# Patient Record
Sex: Female | Born: 1990 | Race: Black or African American | Hispanic: No | Smoking: Former smoker
Health system: Southern US, Community
[De-identification: ages and names within clinical notes are randomized; demographics above are authoritative.]

## PROBLEM LIST (undated history)

## (undated) DIAGNOSIS — I10 Essential (primary) hypertension: Secondary | ICD-10-CM

## (undated) DIAGNOSIS — J45909 Unspecified asthma, uncomplicated: Secondary | ICD-10-CM

## (undated) DIAGNOSIS — K649 Unspecified hemorrhoids: Secondary | ICD-10-CM

---
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
## 2010-08-28 LAB — CULTURE, ROUTINE-ABSCESS

## 2010-08-29 LAB — URINALYSIS, ROUTINE W REFLEX MICROSCOPIC
Bilirubin Urine: NEGATIVE
Glucose, UA: NEGATIVE mg/dL
Hgb urine dipstick: NEGATIVE
Ketones, ur: NEGATIVE mg/dL
Nitrite: NEGATIVE
Protein, ur: NEGATIVE mg/dL
Specific Gravity, Urine: 1.024 (ref 1.005–1.030)
Urobilinogen, UA: 0.2 mg/dL (ref 0.0–1.0)
pH: 6 (ref 5.0–8.0)

## 2010-08-29 LAB — URINE CULTURE: Colony Count: 60000

## 2010-08-29 LAB — PREGNANCY, URINE: Preg Test, Ur: NEGATIVE

## 2010-08-29 LAB — GLUCOSE, CAPILLARY: Glucose-Capillary: 89 mg/dL (ref 70–99)

## 2010-08-30 LAB — GLUCOSE, CAPILLARY: Glucose-Capillary: 88 mg/dL (ref 70–99)

## 2010-08-30 LAB — CULTURE, ROUTINE-ABSCESS

## 2013-10-23 ENCOUNTER — Emergency Department (HOSPITAL_COMMUNITY)
Admission: EM | Admit: 2013-10-23 | Discharge: 2013-10-23 | Disposition: A | Discharge status: Home / Self Care | Payer: Medicaid - Out of State | Attending: Emergency Medicine | Admitting: Emergency Medicine

## 2013-10-23 ENCOUNTER — Encounter (HOSPITAL_COMMUNITY): Payer: Self-pay | Admitting: Emergency Medicine

## 2013-10-23 DIAGNOSIS — K089 Disorder of teeth and supporting structures, unspecified: Secondary | ICD-10-CM | POA: Insufficient documentation

## 2013-10-23 DIAGNOSIS — Z880 Allergy status to penicillin: Secondary | ICD-10-CM | POA: Insufficient documentation

## 2013-10-23 DIAGNOSIS — K0889 Other specified disorders of teeth and supporting structures: Secondary | ICD-10-CM

## 2013-10-23 DIAGNOSIS — K029 Dental caries, unspecified: Secondary | ICD-10-CM | POA: Insufficient documentation

## 2013-10-23 MED ORDER — TRAMADOL HCL 50 MG PO TABS: 50.0000 mg | ORAL_TABLET | Freq: Four times a day (QID) | ORAL | Status: DC | PRN

## 2013-10-23 NOTE — Discharge Instructions (Signed)
Take the prescribed medication as directed.  May take with tylenol for added relief. Follow-up with dentist-- referrals and resource guide provided to help with this. Return to the ED for new or worsening symptoms.   Emergency Department Resource Guide 1) Find a Doctor and Pay Out of Pocket Although you won't have to find out who is covered by your insurance plan, it is a good idea to ask around and get recommendations. You will then need to call the office and see if the doctor you have chosen will accept you as a new patient and what types of options they offer for patients who are self-pay. Some doctors offer discounts or will set up payment plans for their patients who do not have insurance, but you will need to ask so you aren't surprised when you get to your appointment.  2) Contact Your Local Health Department Not all health departments have doctors that can see patients for sick visits, but many do, so it is worth a call to see if yours does. If you don't know where your local health department is, you can check in your phone book. The CDC also has a tool to help you locate your state's health department, and many state websites also have listings of all of their local health departments.  3) Find a Walk-in Clinic If your illness is not likely to be very severe or complicated, you may want to try a walk in clinic. These are popping up all over the country in pharmacies, drugstores, and shopping centers. They're usually staffed by nurse practitioners or physician assistants that have been trained to treat common illnesses and complaints. They're usually fairly quick and inexpensive. However, if you have serious medical issues or chronic medical problems, these are probably not your best option.  No Primary Care Doctor: - Call Health Connect at  [phone] - they can help you locate a primary care doctor that  accepts your insurance, provides certain services, etc. - Physician Referral Service-  [phone]  Chronic Pain Problems: Organization         Address  Phone   Notes  [facility]  [phone] Patients need to be referred by their primary care doctor.   Medication Assistance: Organization         Address  Phone   Notes  [facility] Medication [facility] [address] [place] [place], [place] [address] [phone] --Must be a resident of [facility] -- Must have NO insurance coverage whatsoever (no Medicaid/ Medicare, etc.) -- The pt. MUST have a primary care doctor that directs their care regularly and follows them in the community   [facility]  [phone]   [employer]  [phone]    Agencies that provide inexpensive medical care: Organization         Address  Phone   Notes  [facility]  [phone]   [facility]    [phone]   [facility] [address] [place], [place] [address] [phone]   [facility] [address] [place] [place] [phone]   Planned Parenthood    [phone]   [facility]    [phone]   [facility]  [address], [place] Phone:  [phone], Fax:  [phone] Hours of Operation:  9 am - 6 pm, M-F.  Also accepts Medicaid/Medicare and self-pay.  [facility]  [address], [place] Phone: [phone], Fax: [phone]. Hours of Operation:  8:30 am - 5:30 pm, M-F.  Also accepts Medicaid and self-pay.  [facility] [place] [address], [place] Phone: [phone]   [place], [place], [place] [phone], Ext. [phone] Mondays & Thursdays: 7-9 AM.  First 15 patients are seen on a first come, first serve basis.    [place] Providers:  Organization         Address  Phone   Notes  [facility] [address],  [place] [phone] Also accepts self-pay patients.  [facility] [address], [place]  [phone]   [place], [address], [place] [phone]   [facility] [address] [place] [phone]   Lucianne Lei [address], [place]   [phone] Only accepts [place] Access [place] patients after they have their name applied to their card.   Self-Pay (no insurance) in [facility]:  Organization         Address  Phone   Notes  Sickle Cell Patients, [facility] Internal Medicine [place] [phone]   [facility] Urgent Care [place] [phone]   [facility] [place]  [place], [place], [place] [phone]   [facility]/Dr. Osei-Bonsu  [address], [place] or [address], [place] [phone] Phone number for both [place] and [place] locations is the same.  [facility] [address], [place] [phone]   [facility] [address] [place] or [address] [phone]   [facility] [address], [place] [phone], phone; [phone], fax Sees patients 1st and 3rd Saturday of every month.  Must not qualify for public or private insurance (i.e. Medicaid, Medicare, [place] Health Choice, Veterans' Benefits)  Household income should be no more than 200% of the poverty level The clinic cannot treat you if you are pregnant or think you are pregnant  Sexually transmitted diseases are not treated at the clinic.    Dental Care: Organization         Address  Phone  Notes  [facility] Department of [facility] [phone] Accepts children up to age 29 who are enrolled in  [place] or [place]; pregnant women with a Medicaid card; and children who have applied for Medicaid or [place] Health Choice, but were declined, whose parents can pay a reduced fee at time of service.  [facility] Department of [facility]  [address], [place] [phone] Accepts children up to age 36 who are enrolled in [place] or [place]; pregnant women with a Medicaid card; and children who have applied for Medicaid or [place] Health Choice, but were declined, whose parents can pay a reduced fee at time of service.  [facility] PRO[facility]  [place] [phone] Patients are seen by appointment only. Walk-ins are not accepted. [place] will see patients 66 years of age and older. Monday - Tuesday (8am-5pm) Most Wednesdays (8:30-5pm) $30 per visit, cash only  [facility]  [address], [facility] [phone] Patients are seen by appointment only. Walk-ins are not accepted. [place] will see patients 55 years of age and older. One Wednesday Evening (Monthly: Volunteer Based).  $30 per visit, cash only  [place]  [phone] for adults; Children under age 16, call Graduate Pediatric Dentistry at [phone]. Children aged 15-14, please call [phone] to request a pediatric application.  Dental services are provided in all areas of dental care including fillings, crowns and bridges, complete and partial dentures, implants, gum treatment, root canals, and extractions. Preventive care is also provided. Treatment is provided to both adults and children. Patients are selected via a lottery and there is often a waiting list.   [facility] [address] [place]  [phone] [email or web address]   [facility] [address] [place], [place] [phone], Ext. [phone] Second and Fourth Thursday of each month, opens at 6:30  AM; Clinic ends at 9 AM.  Patients are seen on a first-come first-served basis, and a limited number are seen during each clinic.   [facility]  [address] Hillard Danker [place], [place] [phone]   Eligibility Requirements You must have lived in [place], [place], or [place] counties for at least the last three months.   You cannot be eligible for state or federal sponsored [employer], including [employer], [place], or [employer].   You generally cannot be eligible for healthcare insurance through your employer.    How to apply: Eligibility screenings are held every Tuesday and Wednesday afternoon from 1:00 pm until 4:00 pm. You do not need an appointment for the interview!  [facility] [address] [place], [place]   [employer]  [place] Department  [place]  [phone]    Behavioral Health Resources in the Community: Intensive Outpatient Programs Organization         Address  Phone  Notes  [place]. [address], [place], [place] [phone]   [facility] Outpatient [address], [place], [employer]   ADS: [facility] [address], [place], [place]   [place] [address]  [place], [place] or [phone]   Substance Abuse Resources Organization         Address  Phone  Notes  Al[facility] Drug Services  [phone]   [place]  [phone]   [facility] [place]   Chinita Pester  [phone]   Residential & Outpatient Substance Abuse Program  [phone]   Psychological Services Organization         Address  Phone  Notes  [facility] [place]  [place]  [phone]   [place] [address] [place] [phone] or  [phone]    Mobile Crisis Teams Organization         Address  Phone  Notes  [facility], Mobile Crisis Care Unit  [phone]   [facility]  [address] [place], [place]   [place] [address] [place] [phone]    Self-Help/Support Groups Organization         Address  Phone             Notes  [facility] [place] - variety of support groups  [place] Call for more information  Narcotics Anonymous (NA), [facility] [address], [employer]   2 meetings at this location   Special educational needs teacher         Address  Phone  Notes  [facility] [place],    [place]  [phone]   [facility]  [address] [place] [phone], [place], [place]   [place], [place] [phone] Admissions: 8am-3pm M-F  Incentives Substance [place] [address]    [place], [place] [phone]   [facility] [address] [place], [place], [place]   [facility] [address]  [place], [place]   [facility] - Intensive Outpatient [address], [place], [place]   [facility] ([place].,  [place], [place] [phone] or [phone]   [facility] ([facility]) [address], [place], [place] Accepts Medicaid  [facility] [address],  [place] [phone] Substance Abuse/Addiction Treatment   [facility] Organization         Address  Phone  Notes  [facility]  [phone]   Domenic Schwab, PhD [address] Arlis Porta [place], [place]   [phone] or [phone]   [place], [place] [phone]   [facility] [address]  [place], [place] [phone] Insurance/Medicaid/sponsorship through [facility] and Families [address], [address] [place]                                    [place], [place] [phone] [place] [address], [place] [phone]    Dr. Adele Schilder  [phone]   [facility] Dept. 1) [address], [place] 2) [place] 3)  [place] [address], [place] [phone]  [phone]   [place] [phone] or [phone] (After Hours)

## 2013-10-23 NOTE — ED Provider Notes (Signed)
CSN: [id number]     Arrival date & time 10/23/13  0605 History   First MD Initiated Contact with Patient 10/23/13 [phone]     Chief Complaint  Patient presents with  . Dental Problem     (Consider location/radiation/quality/duration/timing/severity/associated sxs/prior Treatment) The history is provided by the patient and medical records.   This is a 23 y.o. F with no significant PMH presenting to the ED for dental pain.  Patient states her right upper molar broke approximately one year ago the, but has not been bothersome until the past 2 weeks. She states she thinks another piece may have broken off upper tooth. Pain is worse with chewing on the affected side.  No difficulty swallowing. Denies any fever or chills. No facial swelling. Patient is not currently established with a dentist.  History reviewed. No pertinent past medical history. History reviewed. No pertinent past surgical history. No family history on file. History  Substance Use Topics  . Smoking status: Never Smoker   . Smokeless tobacco: Not on file  . Alcohol Use: No   OB History   Grav Para Term Preterm Abortions TAB SAB Ect Mult Living                 Review of Systems  HENT: Positive for dental problem.   All other systems reviewed and are negative.     Allergies  Ciprocinonide; Penicillins; and Aspirin  Home Medications   Prior to Admission medications   Not on File   BP 133/74  Pulse 74  Temp(Src) 98.1 F (36.7 C)  Resp 18  Ht 5\' 6"  (1.676 m)  Wt 320 lb (145.151 kg)  BMI 51.67 kg/m2  SpO2 98%  LMP 10/05/2013  Physical Exam  Nursing note and vitals reviewed. Constitutional: She is oriented to person, place, and time. She appears well-developed and well-nourished.  HENT:  Head: Normocephalic and atraumatic.  Mouth/Throat: Uvula is midline, oropharynx is clear and moist and mucous membranes are normal. No oral lesions. No trismus in the jaw. Abnormal dentition. Dental caries present. No dental  abscesses. No oropharyngeal exudate, posterior oropharyngeal edema, posterior oropharyngeal erythema or tonsillar abscesses.  Teeth largely in poor dentition, severe dental caries; right upper molar broken with large cavity present, surrounding gingiva normal in appearance without signs of dental abscess, handling secretions appropriately, no trismus  Eyes: Conjunctivae and EOM are normal. Pupils are equal, round, and reactive to light.  Neck: Normal range of motion.  Cardiovascular: Normal rate, regular rhythm and normal heart sounds.   Pulmonary/Chest: Effort normal and breath sounds normal.  Musculoskeletal: Normal range of motion.  Neurological: She is alert and oriented to person, place, and time.  Skin: Skin is warm and dry.  Psychiatric: She has a normal mood and affect.    ED Course  Procedures (including critical care time) Labs Review Labs Reviewed - No data to display  Imaging Review No results found.   EKG Interpretation None      MDM   Final diagnoses:  Pain, dental   Dental pain without signs of dental abscess.  Pt is overall well appearing.  Given tramadol for pain, will FU with dentist-- referrals and resource guide provided.  Discussed plan with patient, he/she acknowledged understanding and agreed with plan of care.  Return precautions given for new or worsening symptoms.  Garlon Hatchet, PA-C 10/23/13 [phone]

## 2013-10-23 NOTE — ED Notes (Signed)
Discharge instructions reviewed with pt. Pt verbalized understanding.   

## 2013-10-23 NOTE — ED Notes (Signed)
Pt c/o back upper rt molar pain. Pt states she has been having the pain intermittently for a year. Pt rates pain 8/10. Pt reports difficulty chewing on the tooth. Pt has multiple cavities. Pt reports not being seen by a dentist due to moving around a lot.

## 2013-10-23 NOTE — ED Provider Notes (Signed)
Medical screening examination/treatment/procedure(s) were performed by non-physician practitioner and as supervising physician I was immediately available for consultation/collaboration.   EKG Interpretation None       Djimon Lundstrom M Irma Delancey, MD 10/23/13 0739 

## 2013-10-23 NOTE — ED Notes (Signed)
The pt is c/o a toothache for one year.  Worse for the past few days.  She also has had a rt side jerking for 2 weeks lmp may 14

## 2014-05-16 ENCOUNTER — Inpatient Hospital Stay
Admit: 2014-05-16 | Discharge: 2014-05-16 | Disposition: A | Discharge status: Home / Self Care | Attending: Emergency Medicine

## 2014-05-16 ENCOUNTER — Encounter: Admit: 2014-05-16 | Primary: Internal Medicine

## 2014-05-16 DIAGNOSIS — R059 Cough, unspecified: Secondary | ICD-10-CM

## 2014-05-16 LAB — STREP SCREEN GROUP A THROAT: Rapid Strep A Screen: NEGATIVE

## 2014-05-16 LAB — URINALYSIS
Bilirubin Urine: NEGATIVE
Blood, Urine: NEGATIVE
Glucose, Ur: NEGATIVE mg/dL
Ketones, Urine: NEGATIVE mg/dL
Leukocyte Esterase, Urine: NEGATIVE
Nitrite, Urine: NEGATIVE
Protein, UA: NEGATIVE mg/dL
Specific Gravity, UA: 1.02 (ref 1.005–1.030)
Urobilinogen, Urine: 0.2 E.U./dL (ref <2.0)
pH, UA: 7 (ref 5.0–9.0)

## 2014-05-16 LAB — CBC WITH AUTO DIFFERENTIAL
Basophils %: 1 % (ref 0–2)
Basophils Absolute: 0.06 E9/L (ref 0.00–0.20)
Eosinophils %: 10 % — ABNORMAL HIGH (ref 0–6)
Eosinophils Absolute: 0.65 E9/L — ABNORMAL HIGH (ref 0.05–0.50)
Hematocrit: 38.5 % (ref 34.0–48.0)
Hemoglobin: 12.7 g/dL (ref 11.5–15.5)
Lymphocytes %: 36 % (ref 20–42)
Lymphocytes Absolute: 2.35 E9/L (ref 1.50–4.00)
MCH: 27.4 pg (ref 26.0–35.0)
MCHC: 33 % (ref 32.0–34.5)
MCV: 83 fL (ref 80.0–99.9)
MPV: 9.1 fL (ref 7.0–12.0)
Monocytes %: 11 % (ref 2–12)
Monocytes Absolute: 0.69 E9/L (ref 0.10–0.95)
Neutrophils %: 43 % (ref 43–80)
Neutrophils Absolute: 2.79 E9/L (ref 1.80–7.30)
Platelets: 265 E9/L (ref 130–450)
RBC: 4.64 E12/L (ref 3.50–5.50)
RDW: 16.5 fL — ABNORMAL HIGH (ref 11.5–15.0)
WBC: 6.6 E9/L (ref 4.5–11.5)

## 2014-05-16 LAB — COMPREHENSIVE METABOLIC PANEL
ALT: 13 U/L (ref 0–32)
AST: 18 U/L (ref 0–31)
Albumin: 3.5 g/dL (ref 3.5–5.2)
Alkaline Phosphatase: 89 U/L (ref 35–104)
Anion Gap: 11 mmol/L (ref 7–16)
BUN: 6 mg/dL (ref 6–20)
CO2: 24 mmol/L (ref 22–29)
Calcium: 9.3 mg/dL (ref 8.6–10.2)
Chloride: 103 mmol/L (ref 98–107)
Creatinine: 0.7 mg/dL (ref 0.5–1.0)
GFR African American: >60
GFR Non-African American: >60 mL/min/{1.73_m2} (ref >=60)
Glucose: 78 mg/dL (ref 74–109)
Potassium: 4.4 mmol/L (ref 3.5–5.0)
Sodium: 138 mmol/L (ref 132–146)
Total Bilirubin: 0.1 mg/dL (ref 0.0–1.2)
Total Protein: 7.9 g/dL (ref 6.4–8.3)

## 2014-05-16 LAB — POC PREGNANCY UR-QUAL: Preg Test, Ur: NEGATIVE

## 2014-05-16 LAB — MICROSCOPIC URINALYSIS

## 2014-05-16 LAB — LIPASE: Lipase: 20 U/L (ref 13–60)

## 2014-05-16 LAB — LACTIC ACID: Lactic Acid: 1.1 mmol/L (ref 0.5–2.2)

## 2014-05-16 MED ORDER — PREDNISONE 20 MG PO TABS
20 MG | Freq: Once | ORAL | Status: DC
Start: 2014-05-16 — End: 2014-05-16

## 2014-05-16 MED ORDER — GI COCKTAIL
Freq: Once | Status: AC
Start: 2014-05-16 — End: 2014-05-16
  Administered 2014-05-16: 17:00:00 30 mL via ORAL

## 2014-05-16 MED ORDER — ALBUTEROL SULFATE HFA 108 (90 BASE) MCG/ACT IN AERS
108 (90 Base) MCG/ACT | RESPIRATORY_TRACT | Status: AC | PRN
Start: 2014-05-16 — End: 2015-12-02

## 2014-05-16 MED ORDER — DEXAMETHASONE SODIUM PHOSPHATE 10 MG/ML IJ SOLN
10 MG/ML | Freq: Once | INTRAMUSCULAR | Status: AC
Start: 2014-05-16 — End: 2014-05-16
  Administered 2014-05-16: 19:00:00 10 mg via INTRAVENOUS

## 2014-05-16 MED ORDER — ONDANSETRON HCL 4 MG/2ML IJ SOLN
4 MG/2ML | Freq: Once | INTRAMUSCULAR | Status: AC
Start: 2014-05-16 — End: 2014-05-16
  Administered 2014-05-16: 17:00:00 8 mg via INTRAVENOUS

## 2014-05-16 MED ORDER — PROMETHAZINE HCL 25 MG/ML IJ SOLN
25 MG/ML | Freq: Once | INTRAMUSCULAR | Status: DC
Start: 2014-05-16 — End: 2014-05-16

## 2014-05-16 MED ORDER — METOCLOPRAMIDE HCL 5 MG/ML IJ SOLN
5 MG/ML | Freq: Once | INTRAMUSCULAR | Status: AC
Start: 2014-05-16 — End: 2014-05-16
  Administered 2014-05-16: 19:00:00 10 mg via INTRAVENOUS

## 2014-05-16 MED ORDER — PHENYLEPH-PROMETHAZINE-COD 5-6.25-10 MG/5ML PO SYRP
ORAL | Status: AC | PRN
Start: 2014-05-16 — End: 2014-05-23

## 2014-05-16 MED ORDER — SODIUM CHLORIDE 0.9 % IV BOLUS
0.9 % | Freq: Once | INTRAVENOUS | Status: AC
Start: 2014-05-16 — End: 2014-05-16
  Administered 2014-05-16: 17:00:00 500 mL via INTRAVENOUS

## 2014-05-16 MED ORDER — PROMETHAZINE HCL 25 MG/ML IJ SOLN
25 MG/ML | Freq: Once | INTRAMUSCULAR | Status: AC
Start: 2014-05-16 — End: 2014-05-16
  Administered 2014-05-16: 18:00:00 12.5 mg via INTRAVENOUS

## 2014-05-16 MED ORDER — IPRATROPIUM-ALBUTEROL 0.5-2.5 (3) MG/3ML IN SOLN
Freq: Once | RESPIRATORY_TRACT | Status: AC
Start: 2014-05-16 — End: 2014-05-16
  Administered 2014-05-16: 17:00:00 1 via RESPIRATORY_TRACT

## 2014-05-16 MED ORDER — FAMOTIDINE 20 MG PO TABS
20 MG | ORAL_TABLET | Freq: Two times a day (BID) | ORAL | Status: DC
Start: 2014-05-16 — End: 2015-10-30

## 2014-05-16 MED FILL — GI COCKTAIL: Qty: 30

## 2014-05-16 MED FILL — DEXAMETHASONE SODIUM PHOSPHATE 10 MG/ML IJ SOLN: 10 MG/ML | INTRAMUSCULAR | Qty: 1

## 2014-05-16 MED FILL — ONDANSETRON HCL 4 MG/2ML IJ SOLN: 4 MG/2ML | INTRAMUSCULAR | Qty: 4

## 2014-05-16 MED FILL — PROMETHAZINE HCL 25 MG/ML IJ SOLN: 25 MG/ML | INTRAMUSCULAR | Qty: 1

## 2014-05-16 MED FILL — IPRATROPIUM-ALBUTEROL 0.5-2.5 (3) MG/3ML IN SOLN: RESPIRATORY_TRACT | Qty: 3

## 2014-05-16 MED FILL — METOCLOPRAMIDE HCL 5 MG/ML IJ SOLN: 5 MG/ML | INTRAMUSCULAR | Qty: 2

## 2014-05-16 NOTE — ED Notes (Signed)
Pt offered liquids at this time.     Yvetta Coderawnya Abbegale Stehle, RN  05/16/14 1248

## 2014-05-16 NOTE — ED Notes (Signed)
Pt given 8mg  Zofran but still experienced nausea after GI cocktail. Friend at bedside offering support. Pt offered comfort and cold wet rag.     Yvetta Coderawnya Debria Broecker, RN  05/16/14 1155

## 2014-05-16 NOTE — ED Notes (Signed)
[facility] to [employer] [facility]     Juvon Teater, RN  05/16/14 1158

## 2014-05-16 NOTE — ED Provider Notes (Signed)
HPI:  05/16/14,   Time: 11:53 AM         Candice Yates is a 23 y.o. female presenting to the ED for several complaints including cough sore throat nausea vomiting, beginning 4 days ago.  The complaint has been intermittent, moderate in severity, and worsened by when she smokes.  Patient presents for several complaints. States she's had runny nose and itchy eyes sore throat and nonproductive cough for the last 4-5 days. Current everyday cigar smoker. Denies sick contacts. She also reports she's been throwing up any time she eats anything. Denies hematemesis or hematochezia. Last vomited once yesterday. She does report she was able to smoke a cigar and have breakfast this morning without difficulty. She denies fevers chills back pain abdominal pain or other complaints. She did not receive a flu vaccine this year    ROS:   Pertinent positives and negatives are stated within HPI, all other systems reviewed and are negative.  --------------------------------------------- PAST HISTORY ---------------------------------------------  Past Medical History:  has a past medical history of PMDD (premenstrual dysphoric disorder).    Past Surgical History:  has no past surgical history on file.    Social History:  reports that she has been smoking Cigars.  She has never used smokeless tobacco. She reports that she drinks alcohol. She reports that she does not use illicit drugs.    Family History: family history is not on file.     The patient???s home medications have been reviewed.    Allergies: Aspirin; Clindamycin/lincomycin; Flagyl; and Pcn    -------------------------------------------------- RESULTS -------------------------------------------------  All laboratory and radiology results have been personally reviewed by myself   LABS:  Results for orders placed during the hospital encounter of 05/16/14   STREP SCREEN GROUP A THROAT       Result Value Ref Range    Rapid Strep A Screen Negative  Negative    Rapid A Strep  Antigen QC see below     THROAT CULTURE       Result Value Ref Range    Strep A Culture Evaluating for Beta hemolytic streptococci     CBC WITH AUTO DIFFERENTIAL       Result Value Ref Range    WBC 6.6  4.5 - 11.5 E9/L    RBC 4.64  3.50 - 5.50 E12/L    Hemoglobin 12.7  11.5 - 15.5 g/dL    Hematocrit 24.438.5  01.034.0 - 48.0 %    MCV 83.0  80.0 - 99.9 fL    MCH 27.4  26.0 - 35.0 pg    MCHC 33.0  32.0 - 34.5 %    RDW 16.5 (*) 11.5 - 15.0 fL    Platelets 265  130 - 450 E9/L    MPV 9.1  7.0 - 12.0 fL    Neutrophils Relative 43  43 - 80 %    Lymphocytes Relative 36  20 - 42 %    Monocytes Relative 11  2 - 12 %    Eosinophils Relative Percent 10 (*) 0 - 6 %    Basophils Relative 1  0 - 2 %    Neutrophils Absolute 2.79  1.80 - 7.30 E9/L    Lymphocytes Absolute 2.35  1.50 - 4.00 E9/L    Monocytes Absolute 0.69  0.10 - 0.95 E9/L    Eosinophils Absolute 0.65 (*) 0.05 - 0.50 E9/L    Basophils Absolute 0.06  0.00 - 0.20 E9/L    Anisocytosis 1+     COMPREHENSIVE  METABOLIC PANEL       Result Value Ref Range    Sodium 138  132 - 146 mmol/L    Potassium 4.4  3.5 - 5.0 mmol/L    Chloride 103  98 - 107 mmol/L    CO2 24  22 - 29 mmol/L    Anion Gap 11  7 - 16 mmol/L    Glucose 78  74 - 109 mg/dL    BUN 6  6 - 20 mg/dL    CREATININE 0.7  0.5 - 1.0 mg/dL    GFR Non-African American >60  >=60 mL/min/1.73    GFR African American >60      Calcium 9.3  8.6 - 10.2 mg/dL    Total Protein 7.9  6.4 - 8.3 g/dL    Alb 3.5  3.5 - 5.2 g/dL    Total Bilirubin 0.1  0.0 - 1.2 mg/dL    Alkaline Phosphatase 89  35 - 104 U/L    ALT 13  0 - 32 U/L    AST 18  0 - 31 U/L   LACTIC ACID, PLASMA       Result Value Ref Range    Lactic Acid 1.1  0.5 - 2.2 mmol/L   LIPASE       Result Value Ref Range    Lipase 20  13 - 60 U/L   URINALYSIS       Result Value Ref Range    Color, UA Yellow  Straw/Yellow    Clarity, UA SLCLOUDY  Clear    Glucose, Ur Negative  Negative mg/dL    Bilirubin Urine Negative  Negative    Ketones, Urine Negative  Negative mg/dL    Specific Gravity,  UA 1.020  1.005 - 1.030    Blood, Urine Negative  Negative    pH, UA 7.0  5.0 - 9.0    Protein, UA Negative  Negative mg/dL    Urobilinogen, Urine 0.2  < 2.0 E.U./dL    Nitrite, Urine Negative  Negative    Leukocyte Esterase, Urine Negative  Negative   MICROSCOPIC URINALYSIS       Result Value Ref Range    WBC, UA 0-1  0 - 5 /HPF    RBC, UA 0-1  0 - 2 /HPF    Epi Cells FEW      Bacteria, UA FEW (*)     Amorphous, UA FEW     POC PREGNANCY UR-QUAL       Result Value Ref Range    Preg Test, Ur negative      QC OK? yes         RADIOLOGY:  Interpreted by Radiologist.  XR CHEST STANDARD TWO VW    Final Result: IMPRESSION:     Normal chest.       ------------------------- NURSING NOTES AND VITALS REVIEWED ---------------------------   The nursing notes within the ED encounter and vital signs as below have been reviewed.   BP 117/61 mmHg   Pulse 84   Temp(Src) 98.9 ??F (37.2 ??C) (Oral)   Resp 16   Ht 5\' 4"  (1.626 m)   Wt 320 lb (145.151 kg)   BMI 54.90 kg/m2   SpO2 100%   LMP 05/12/2014   Breastfeeding? No  Oxygen Saturation Interpretation: Normal      ---------------------------------------------------PHYSICAL EXAM--------------------------------------       Constitutional/General: Alert and oriented x3, well appearing, non toxic in NAD  Head: NC/AT  Eyes: PERRL, EOMI  Mouth: Oropharynx clear, handling secretions,  no trismus. There is mild posterior or things erythema but no exudate.  Neck: Supple, full ROM, no meningeal signs there is no tender cervical adenopathy no JVD   Chest: He complains of mild left lower rib pain. There is no crepitus or deformity. Good inspiratory effort  Pulmonary: Diffuse breath sounds which are diminished, however Lungs clear to auscultation bilaterally, no wheezes, rales, or rhonchi. Not in respiratory distress  Cardiovascular:  Regular rate and rhythm, no murmurs, gallops, or rubs. 2+ distal pulses  Abdomen: Soft, non tender, non distended,   Extremities: Moves all extremities x 4. Warm and  well perfused There is no pretibial edema nor calf tenderness bilaterally     Skin: warm and dry without rash  Neurologic: GCS 15,  Psych: Normal Affect      ------------------------------ ED COURSE/MEDICAL DECISION MAKING----------------------  Medications   ondansetron (ZOFRAN) injection 8 mg (8 mg Intravenous Given 05/16/14 1139)   gi cocktail 30 mL (30 mLs Oral Given 05/16/14 1139)   0.9 % sodium chloride bolus (0 mLs Intravenous Stopped 05/16/14 1245)   ipratropium-albuterol (DUONEB) nebulizer solution 1 ampule (1 ampule Inhalation Given 05/16/14 1225)   promethazine (PHENERGAN) injection 12.5 mg (12.5 mg Intravenous Given 05/16/14 1250)   metoclopramide (REGLAN) injection 10 mg (10 mg Intravenous Given 05/16/14 1339)   dexamethasone (DECADRON) injection 10 mg (10 mg Intravenous Given 05/16/14 1339)         Medical Decision Making:    Encouraged smoking cessation, will treat his bronchitis hav follow-up with primary care return for worsening signs or symptoms    Time: 1300h  Re-evaluation.  Patient???s symptoms are improving  Repeat physical examination is improved        Counseling:   The emergency provider has spoken with the patient and discussed today???s results, in addition to providing specific details for the plan of care and counseling regarding the diagnosis and prognosis.  Questions are answered at this time and they are agreeable with the plan.      --------------------------------- IMPRESSION AND DISPOSITION ---------------------------------    IMPRESSION  1. Cough    2. Post-tussive emesis    3. Non-intractable vomiting with nausea, vomiting of unspecified type        DISPOSITION  Disposition: Discharge to home  Patient condition is stable                  Raphael GibneyMatthew D Chirag Krueger, DO  05/17/14 1132

## 2014-05-18 LAB — CULTURE, THROAT

## 2014-09-11 NOTE — Telephone Encounter (Signed)
Patient called to make appointment to establish as a patient, [facility].  Scheduled new appointment for 10-12-14, when doing registration states was in [facility] 2-3 weeks ago and did not have follow up visit, offered to look for an appointment at another office to  get her in sooner, states she needs seen as soon as possible because having rectal bleeding now and it's not normal. States she is going to go to the emergency room now for the rectal bleeding.

## 2014-09-26 ENCOUNTER — Encounter: Payer: PRIVATE HEALTH INSURANCE | Attending: Family Medicine | Primary: Internal Medicine

## 2014-10-01 ENCOUNTER — Encounter: Attending: Physician Assistant | Primary: Internal Medicine

## 2014-10-12 ENCOUNTER — Encounter: Payer: PRIVATE HEALTH INSURANCE | Attending: Internal Medicine | Primary: Internal Medicine

## 2014-12-30 ENCOUNTER — Inpatient Hospital Stay: Admit: 2014-12-30 | Discharge: 2014-12-31 | Disposition: A | Discharge status: Home / Self Care

## 2014-12-30 ENCOUNTER — Emergency Department: Admit: 2014-12-31 | Primary: Internal Medicine

## 2014-12-30 DIAGNOSIS — N3001 Acute cystitis with hematuria: Secondary | ICD-10-CM

## 2014-12-30 LAB — POC PREGNANCY UR-QUAL: Preg Test, Ur: NEGATIVE

## 2014-12-30 NOTE — ED Provider Notes (Signed)
ED Attending      ??  Department of Emergency Medicine   ED  Provider Note  Admit Date/Time: 12/30/2014  7:34 PM  ED Bed: 33/33   MRN: [id number]  Chief Complaint:   Migraine - x3 days, been taking pain meds with no relief, c/o nausea without vomiting or diarrhea and Back Pain       History of Present Illness   Source of history provided by:  patient.  History/Exam Limitations: none.       Candice Yates is a 24 y.o. female who has a past medical history of:   Past Medical History   Diagnosis Date   ??? PMDD (premenstrual dysphoric disorder)    Presents to the emergency department for multiple complaints.  Her first complaint is a migraine type headache which has been bothering her for the last 3 days.  She states that the pain starts behind her eyes and effects the entire front of her head.  She is tried taking ibuprofen over-the-counter without relief.  Rates her pain as moderate with associated nausea.  Her second complaint is lower back pain which has been affecting her for several weeks.  She she denies any bowel or bladder incontinence.  Denies saddle anesthesia.  Denies any fever, chills, night sweats, numbness and/or weakness of her extremities, neck pain, or mid back pain.  Her third complaint is abdominal pain, worse at her right lower quadrant.  States the pain has lasted over the past 2 months and she has not had a single bowel movement over this time.  Pain is constant since onset and rated as moderate.  She states that her abdominal pain radiates into her back, however it is different from back pain she is currently experiencing. She has associated symptoms of nausea and constipation, and denies chest pains, shortness of breath, vomiting, and diarrhea.    ROS   Pertinent positives and negatives are stated within HPI, all other systems reviewed and are negative.     No past surgical history on file.Social History:  reports that she has been smoking Cigars.  She has been smoking about 0.10 packs per day. She  has never used smokeless tobacco. She reports that she drinks alcohol. She reports that she does not use illicit drugs.  Family History: family history is not on file.   Allergies: Aspirin; Clindamycin/lincomycin; Flagyl [metronidazole]; and Pcn [penicillins]    Physical Exam Section   Oxygen Saturation Interpretation: Normal.   ED Triage Vitals   BP Temp Temp src Pulse Resp SpO2 Height Weight   12/30/14 1932 12/30/14 1932 -- 12/30/14 1932 12/30/14 1932 12/30/14 1932 12/30/14 1932 12/30/14 1932   156/104 96.8 ??F (36 ??C)  101 18 96 % 5\' 4"  (1.626 m) 315 lb (142.9 kg)       Physical Exam  ?? Constitutional/General: Alert and oriented x3, morbidly obese, non toxic in NAD  ?? HEENT:  NC/NT. PERRLA,  Airway patent.  ?? Neck: Supple, full ROM, non tender to palpation in the midline, no stridor, no crepitus, no meningeal signs  ?? Respiratory: Lung sounds reduced bilaterally. Lungs clear to auscultation bilaterally, no wheezes, rales, or rhonchi. Not in respiratory distress.   ?? CV:  Regular rate. Regular rhythm. No murmurs, gallops, or rubs. 2+ distal pulses  ?? GI:  Abdomen Soft, mildly tender in the RLQ, no rebound tenderness, no guarding. Non distended.  +BS.   No rebound, guarding, or rigidity. No pulsatile masses.  ?? Musculoskeletal: Moves all extremities x 4.  Warm and well perfused, no clubbing, cyanosis, or edema.  ?? Integument: skin warm and dry. No rashes.   ?? Lymphatic: no lymphadenopathy noted  ?? Neurologic: GCS 15, no focal deficits, symmetric strength 5/5 in the upper and lower extremities bilaterally  ?? Psychiatric: Normal Affect      Lab / Imaging Results   (All laboratory and radiology results have been personally reviewed by myself)  Labs:  Results for orders placed or performed during the hospital encounter of 12/30/14   CBC Auto Differential   Result Value Ref Range    WBC 8.1 4.5 - 11.5 E9/L    RBC 4.53 3.50 - 5.50 E12/L    Hemoglobin 12.1 11.5 - 15.5 g/dL    Hematocrit 19.1 47.8 - 48.0 %    MCV 82.1 80.0 -  99.9 fL    MCH 26.7 26.0 - 35.0 pg    MCHC 32.5 32.0 - 34.5 %    RDW 16.6 (H) 11.5 - 15.0 fL    Platelets 259 130 - 450 E9/L    MPV 9.4 7.0 - 12.0 fL    Neutrophils Relative 51 43 - 80 %    Lymphocytes Relative 36 20 - 42 %    Monocytes Relative 8 2 - 12 %    Eosinophils Relative Percent 5 0 - 6 %    Basophils Relative 1 0 - 2 %    Neutrophils Absolute 4.10 1.80 - 7.30 E9/L    Lymphocytes Absolute 2.90 1.50 - 4.00 E9/L    Monocytes Absolute 0.63 0.10 - 0.95 E9/L    Eosinophils Absolute 0.44 0.05 - 0.50 E9/L    Basophils Absolute 0.05 0.00 - 0.20 E9/L   Urinalysis   Result Value Ref Range    Color, UA RED (A) Straw/Yellow    Clarity, UA CLOUDY (A) Clear    Glucose, Ur Negative Negative mg/dL    Bilirubin Urine Negative Negative    Ketones, Urine 15 (A) Negative mg/dL    Specific Gravity, UA 1.025 1.005 - 1.030    Blood, Urine LARGE (A) Negative    pH, UA 5.0 5.0 - 9.0    Protein, UA 100 (A) Negative mg/dL    Urobilinogen, Urine 1.0 <2.0 E.U./dL    Nitrite, Urine POSITIVE (A) Negative    Leukocyte Esterase, Urine TRACE (A) Negative   Microscopic Urinalysis   Result Value Ref Range    WBC, UA 1-3 0 - 5 /HPF    RBC, UA PACKED 0 - 2 /HPF    Bacteria, UA MODERATE (A) /HPF   POC Pregnancy Urine Qual   Result Value Ref Range    Preg Test, Ur negative     QC OK? ok      Imaging:  All Radiology results interpreted by Radiologist unless otherwise noted.  CT ABDOMEN PELVIS W IV CONTRAST Additional Contrast? Oral    (Results Pending)       ED Course / Medical Decision Making     Medications   0.9 % sodium chloride bolus (1,000 mLs Intravenous New Bag 12/30/14 2037)   ondansetron (ZOFRAN) injection 4 mg (not administered)   ketorolac (TORADOL) injection 15 mg (15 mg Intravenous Given 12/30/14 2044)   diphenhydrAMINE (BENADRYL) injection 25 mg (25 mg Intravenous Given 12/30/14 2049)   metoclopramide (REGLAN) injection 10 mg (10 mg Intravenous Given 12/30/14 2049)   iohexol (OMNIPAQUE 240) injection 50 mL (50 mLs Oral Given 12/30/14 2038)      ED Course   There is no data  filed.     Re-Evaluations:  12/30/14      Time: 2100    Patient???s symptoms are improving. Patient is requesting food. She is to be NPO until after CT obtained and interpreted.    Consultations:             None    Procedures:   none    MDM:  Patient presents to emergency department for multiple complaints in her symptoms were cautiously add aggressively managed in the emergency department. Labs the patient in this patient's care consisted of a history, physical examination, and ordering labs/imaging. The remainder of this patient's care will be performed by Westley Gambles.    Counseling:   I have spoken with the patient and discussed today???s results, in addition to providing specific details for the plan of care and counseling regarding the diagnosis and prognosis and are agreeable with the plan.     Assessment      1. Acute cystitis with hematuria    2. Migraine without status migrainosus, not intractable, unspecified migraine type    3. Acute bilateral low back pain without sciatica    4. Constipation, unspecified constipation type      This patient's ED course included: a personal history and physicial examination, re-evaluation prior to disposition, multiple bedside re-evaluations and IV medications     Plan   Refer to [facility] pending CT results.    New Medications     New Prescriptions    No medications on file     Electronically signed by Burnett Kanaris, PA-C   DD: 12/30/14  **This report was transcribed using voice recognition software. Every effort was made to ensure accuracy; however, inadvertent computerized transcription errors may be present.  END OF PROVIDER NOTE       Burnett Kanaris, PA-C  12/30/14 2103       Burnett Kanaris, PA-C  12/30/14 2103

## 2014-12-30 NOTE — ED Notes (Signed)
Multiple attempts made obtaining labs and iv site. Dr aware. No distress noted.      Dorene GrebeFawn C Glendia Olshefski, RN  12/30/14 2123

## 2014-12-31 LAB — COMPREHENSIVE METABOLIC PANEL
ALT: 13 U/L (ref 0–32)
AST: 25 U/L (ref 0–31)
Albumin: 3.5 g/dL (ref 3.5–5.2)
Alkaline Phosphatase: 95 U/L (ref 35–104)
Anion Gap: 14 mmol/L (ref 7–16)
BUN: 7 mg/dL (ref 6–20)
CO2: 22 mmol/L (ref 22–29)
Calcium: 9.2 mg/dL (ref 8.6–10.2)
Chloride: 106 mmol/L (ref 98–107)
Creatinine: 0.8 mg/dL (ref 0.5–1.0)
GFR African American: >60
GFR Non-African American: >60 mL/min/{1.73_m2} (ref >=60)
Glucose: 91 mg/dL (ref 74–109)
Potassium: 4.7 mmol/L (ref 3.5–5.0)
Sodium: 142 mmol/L (ref 132–146)
Total Bilirubin: 0.1 mg/dL (ref 0.0–1.2)
Total Protein: 7.9 g/dL (ref 6.4–8.3)

## 2014-12-31 LAB — URINALYSIS
Bilirubin Urine: NEGATIVE
Glucose, Ur: NEGATIVE mg/dL
Ketones, Urine: 15 mg/dL — AB
Nitrite, Urine: POSITIVE — AB
Protein, UA: 100 mg/dL — AB
Specific Gravity, UA: 1.025 (ref 1.005–1.030)
Urobilinogen, Urine: 1 E.U./dL (ref <2.0)
pH, UA: 5 (ref 5.0–9.0)

## 2014-12-31 LAB — CBC WITH AUTO DIFFERENTIAL
Basophils %: 1 % (ref 0–2)
Basophils Absolute: 0.05 E9/L (ref 0.00–0.20)
Eosinophils %: 5 % (ref 0–6)
Eosinophils Absolute: 0.44 E9/L (ref 0.05–0.50)
Hematocrit: 37.2 % (ref 34.0–48.0)
Hemoglobin: 12.1 g/dL (ref 11.5–15.5)
Lymphocytes %: 36 % (ref 20–42)
Lymphocytes Absolute: 2.9 E9/L (ref 1.50–4.00)
MCH: 26.7 pg (ref 26.0–35.0)
MCHC: 32.5 % (ref 32.0–34.5)
MCV: 82.1 fL (ref 80.0–99.9)
MPV: 9.4 fL (ref 7.0–12.0)
Monocytes %: 8 % (ref 2–12)
Monocytes Absolute: 0.63 E9/L (ref 0.10–0.95)
Neutrophils %: 51 % (ref 43–80)
Neutrophils Absolute: 4.1 E9/L (ref 1.80–7.30)
Platelets: 259 E9/L (ref 130–450)
RBC: 4.53 E12/L (ref 3.50–5.50)
RDW: 16.6 fL — ABNORMAL HIGH (ref 11.5–15.0)
WBC: 8.1 E9/L (ref 4.5–11.5)

## 2014-12-31 LAB — LACTIC ACID: Lactic Acid: 0.8 mmol/L (ref 0.5–2.2)

## 2014-12-31 LAB — MICROSCOPIC URINALYSIS

## 2014-12-31 LAB — LIPASE: Lipase: 30 U/L (ref 13–60)

## 2014-12-31 MED ORDER — ONDANSETRON HCL 4 MG/2ML IJ SOLN
4 MG/2ML | Freq: Once | INTRAMUSCULAR | Status: DC
Start: 2014-12-31 — End: 2014-12-30

## 2014-12-31 MED ORDER — IOVERSOL 68 % IV SOLN
68 % | Freq: Once | INTRAVENOUS | Status: AC | PRN
Start: 2014-12-31 — End: 2014-12-30
  Administered 2014-12-31: 02:00:00 125 mL via INTRAVENOUS

## 2014-12-31 MED ORDER — ONDANSETRON HCL 4 MG/2ML IJ SOLN
4 MG/2ML | Freq: Once | INTRAMUSCULAR | Status: DC
Start: 2014-12-31 — End: 2014-12-31

## 2014-12-31 MED ORDER — SULFAMETHOXAZOLE-TRIMETHOPRIM 800-160 MG PO TABS
800-160 MG | ORAL_TABLET | Freq: Two times a day (BID) | ORAL | 0 refills | Status: AC
Start: 2014-12-31 — End: 2015-01-06

## 2014-12-31 MED ORDER — POLYETHYLENE GLYCOL 3350 17 G PO PACK
17 g | Freq: Every day | ORAL | 1 refills | Status: AC | PRN
Start: 2014-12-31 — End: 2015-01-29

## 2014-12-31 MED ORDER — KETOROLAC TROMETHAMINE 30 MG/ML IJ SOLN
30 MG/ML | Freq: Once | INTRAMUSCULAR | Status: AC
Start: 2014-12-31 — End: 2014-12-30
  Administered 2014-12-31: 01:00:00 15 mg via INTRAVENOUS

## 2014-12-31 MED ORDER — ONDANSETRON HCL 4 MG/2ML IJ SOLN
4 MG/2ML | INTRAMUSCULAR | Status: AC
Start: 2014-12-31 — End: 2014-12-30
  Administered 2014-12-31: 01:00:00 4 via INTRAVENOUS

## 2014-12-31 MED ORDER — IOHEXOL 240 MG/ML IJ SOLN
240 MG/ML | Freq: Once | INTRAMUSCULAR | Status: AC | PRN
Start: 2014-12-31 — End: 2014-12-30
  Administered 2014-12-31: 01:00:00 50 mL via ORAL

## 2014-12-31 MED ORDER — DIPHENHYDRAMINE HCL 50 MG/ML IJ SOLN
50 MG/ML | Freq: Once | INTRAMUSCULAR | Status: AC
Start: 2014-12-31 — End: 2014-12-30
  Administered 2014-12-31: 01:00:00 25 mg via INTRAVENOUS

## 2014-12-31 MED ORDER — METOCLOPRAMIDE HCL 5 MG/ML IJ SOLN
5 MG/ML | Freq: Once | INTRAMUSCULAR | Status: AC
Start: 2014-12-31 — End: 2014-12-30
  Administered 2014-12-31: 01:00:00 10 mg via INTRAVENOUS

## 2014-12-31 MED ORDER — SODIUM CHLORIDE 0.9 % IV BOLUS
0.9 % | Freq: Once | INTRAVENOUS | Status: AC
Start: 2014-12-31 — End: 2014-12-30
  Administered 2014-12-31: 01:00:00 1000 mL via INTRAVENOUS

## 2014-12-31 MED ORDER — BUTALBITAL-APAP-CAFFEINE 50-325-40 MG PO TABS
50-325-40 MG | ORAL_TABLET | Freq: Four times a day (QID) | ORAL | 0 refills | Status: DC | PRN
Start: 2014-12-31 — End: 2015-10-30

## 2014-12-31 MED FILL — DIPHENHYDRAMINE HCL 50 MG/ML IJ SOLN: 50 MG/ML | INTRAMUSCULAR | Qty: 1

## 2014-12-31 MED FILL — KETOROLAC TROMETHAMINE 30 MG/ML IJ SOLN: 30 MG/ML | INTRAMUSCULAR | Qty: 1

## 2014-12-31 MED FILL — METOCLOPRAMIDE HCL 5 MG/ML IJ SOLN: 5 MG/ML | INTRAMUSCULAR | Qty: 2

## 2014-12-31 MED FILL — ONDANSETRON HCL 4 MG/2ML IJ SOLN: 4 MG/2ML | INTRAMUSCULAR | Qty: 2

## 2014-12-31 NOTE — ED Provider Notes (Signed)
Care endorsed to me by Neta Mends. CAT scan reveals no acute findings.  Patient complains of constipation.  We'll provide MiraLAX for home.  She does have a urinary tract infection.  She'll be home on Antivert by.  She is to return if any symptoms worsen.  She feels better and is okay to go home.  She denies any headache currently     [place], [place]  12/31/14 [id number]

## 2015-08-22 ENCOUNTER — Encounter: Admit: 2015-08-22 | Primary: Internal Medicine

## 2015-08-22 ENCOUNTER — Inpatient Hospital Stay: Admit: 2015-08-22 | Discharge: 2015-08-22 | Discharge status: Against Medical Advice

## 2015-08-22 LAB — BASIC METABOLIC PANEL
Anion Gap: 12 mmol/L (ref 7–16)
BUN: 9 mg/dL (ref 6–20)
CO2: 22 mmol/L (ref 22–29)
Calcium: 9.4 mg/dL (ref 8.6–10.2)
Chloride: 105 mmol/L (ref 98–107)
Creatinine: 0.6 mg/dL (ref 0.5–1.0)
GFR African American: >60
GFR Non-African American: >60 mL/min/{1.73_m2} (ref >=60)
Glucose: 81 mg/dL (ref 74–109)
Potassium: 4.1 mmol/L (ref 3.5–5.0)
Sodium: 139 mmol/L (ref 132–146)

## 2015-08-22 LAB — CBC WITH AUTO DIFFERENTIAL
Basophils %: 0.3 % (ref 0.0–2.0)
Basophils Absolute: 0.02 E9/L (ref 0.00–0.20)
Eosinophils %: 5.1 % (ref 0.0–6.0)
Eosinophils Absolute: 0.38 E9/L (ref 0.05–0.50)
Hematocrit: 41.3 % (ref 34.0–48.0)
Hemoglobin: 13.4 g/dL (ref 11.5–15.5)
Immature Granulocytes #: 0.02 E9/L
Immature Granulocytes %: 0.3 % (ref 0.0–5.0)
Lymphocytes %: 34.6 % (ref 20.0–42.0)
Lymphocytes Absolute: 2.55 E9/L (ref 1.50–4.00)
MCH: 26.6 pg (ref 26.0–35.0)
MCHC: 32.4 % (ref 32.0–34.5)
MCV: 82.1 fL (ref 80.0–99.9)
MPV: 10.9 fL (ref 7.0–12.0)
Monocytes %: 9.2 % (ref 2.0–12.0)
Monocytes Absolute: 0.68 E9/L (ref 0.10–0.95)
Neutrophils %: 50.5 % (ref 43.0–80.0)
Neutrophils Absolute: 3.73 E9/L (ref 1.80–7.30)
Platelets: 267 E9/L (ref 130–450)
RBC: 5.03 E12/L (ref 3.50–5.50)
RDW: 15.5 fL — ABNORMAL HIGH (ref 11.5–15.0)
WBC: 7.4 E9/L (ref 4.5–11.5)

## 2015-08-22 LAB — POC PREGNANCY UR-QUAL: Preg Test, Ur: NEGATIVE

## 2015-08-22 LAB — EKG 12-LEAD
Atrial Rate: 95 {beats}/min
P Axis: 47 degrees
P-R Interval: 142 ms
Q-T Interval: 338 ms
QRS Duration: 76 ms
QTc Calculation (Bazett): 424 ms
R Axis: 4 degrees
T Axis: 8 degrees
Ventricular Rate: 95 {beats}/min

## 2015-08-22 LAB — D-DIMER, QUANTITATIVE: D-Dimer, Quant: <200 ng/mL DDU

## 2015-08-22 LAB — TROPONIN: Troponin: <0.01 ng/mL (ref 0.00–0.03)

## 2015-08-22 NOTE — ED Notes (Signed)
FIRST PROVIDER CONTACT ASSESSMENT NOTE   ??  Department of Emergency Medicine   Admit Date: No admission date for patient encounter.    Chief Complaint: Chest Pain (worse at night while sleeping) and Leg Pain (RLE, chronic shince having MRSA)      History of Present Illness:    Candice Yates is a 25 y.o. female who presents to the ED for Right leg pain and L sided chest pain. Patient has chronic right leg pain from a previous procedure. She has no hx DVT or PE        -----------------END OF FIRST PROVIDER CONTACT ASSESSMENT NOTE--------------  Electronically signed by Avis EpleyFRANCHESSCA COLELLA, PA   DD: 08/22/15               Avis EpleyFranchessca Colella, PA  08/22/15 1410

## 2015-09-17 ENCOUNTER — Encounter: Attending: Internal Medicine | Primary: Internal Medicine

## 2015-10-26 ENCOUNTER — Emergency Department: Admit: 2015-10-27 | Primary: Internal Medicine

## 2015-10-26 DIAGNOSIS — R1084 Generalized abdominal pain: Secondary | ICD-10-CM

## 2015-10-26 NOTE — ED Notes (Signed)
Bed: H01  Expected date:   Expected time:   Means of arrival:   Comments:  Bonnita Levanjenn     Katie M Simms, RN  10/26/15 2023
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
## 2015-10-27 ENCOUNTER — Inpatient Hospital Stay: Admit: 2015-10-27 | Discharge: 2015-10-27 | Disposition: A | Discharge status: Home / Self Care

## 2015-10-27 LAB — CBC WITH AUTO DIFFERENTIAL
Basophils %: 0.3 % (ref 0.0–2.0)
Basophils Absolute: 0.02 E9/L (ref 0.00–0.20)
Eosinophils %: 7.1 % — ABNORMAL HIGH (ref 0.0–6.0)
Eosinophils Absolute: 0.41 E9/L (ref 0.05–0.50)
Hematocrit: 38.6 % (ref 34.0–48.0)
Hemoglobin: 12.3 g/dL (ref 11.5–15.5)
Immature Granulocytes #: 0.01 E9/L
Immature Granulocytes %: 0.2 % (ref 0.0–5.0)
Lymphocytes %: 43.6 % — ABNORMAL HIGH (ref 20.0–42.0)
Lymphocytes Absolute: 2.5 E9/L (ref 1.50–4.00)
MCH: 26.7 pg (ref 26.0–35.0)
MCHC: 31.9 % — ABNORMAL LOW (ref 32.0–34.5)
MCV: 83.7 fL (ref 80.0–99.9)
MPV: 10.9 fL (ref 7.0–12.0)
Monocytes %: 7.5 % (ref 2.0–12.0)
Monocytes Absolute: 0.43 E9/L (ref 0.10–0.95)
Neutrophils %: 41.3 % — ABNORMAL LOW (ref 43.0–80.0)
Neutrophils Absolute: 2.37 E9/L (ref 1.80–7.30)
Platelets: 273 E9/L (ref 130–450)
RBC: 4.61 E12/L (ref 3.50–5.50)
RDW: 15.5 fL — ABNORMAL HIGH (ref 11.5–15.0)
WBC: 5.7 E9/L (ref 4.5–11.5)

## 2015-10-27 LAB — POC PREGNANCY UR-QUAL: Preg Test, Ur: NEGATIVE

## 2015-10-27 LAB — URINALYSIS
Bilirubin Urine: NEGATIVE
Blood, Urine: NEGATIVE
Glucose, Ur: NEGATIVE mg/dL
Ketones, Urine: NEGATIVE mg/dL
Leukocyte Esterase, Urine: NEGATIVE
Nitrite, Urine: NEGATIVE
Protein, UA: NEGATIVE mg/dL
Specific Gravity, UA: 1.025 (ref 1.005–1.030)
Urobilinogen, Urine: 0.2 E.U./dL (ref <2.0)
pH, UA: 6.5 (ref 5.0–9.0)

## 2015-10-27 LAB — LIPASE: Lipase: 24 U/L (ref 13–60)

## 2015-10-27 LAB — COMPREHENSIVE METABOLIC PANEL
ALT: 10 U/L (ref 0–32)
AST: 16 U/L (ref 0–31)
Albumin: 3.8 g/dL (ref 3.5–5.2)
Alkaline Phosphatase: 86 U/L (ref 35–104)
Anion Gap: 13 mmol/L (ref 7–16)
BUN: 9 mg/dL (ref 6–20)
CO2: 23 mmol/L (ref 22–29)
Calcium: 9 mg/dL (ref 8.6–10.2)
Chloride: 103 mmol/L (ref 98–107)
Creatinine: 0.7 mg/dL (ref 0.5–1.0)
GFR African American: >60
GFR Non-African American: >60 mL/min/{1.73_m2} (ref >=60)
Glucose: 87 mg/dL (ref 74–109)
Potassium: 3.8 mmol/L (ref 3.5–5.0)
Sodium: 139 mmol/L (ref 132–146)
Total Bilirubin: <0.2 mg/dL (ref 0.0–1.2)
Total Protein: 8.1 g/dL (ref 6.4–8.3)

## 2015-10-27 LAB — LACTIC ACID: Lactic Acid: 0.6 mmol/L (ref 0.5–2.2)

## 2015-10-27 MED ORDER — TRIAMCINOLONE ACETONIDE 0.5 % EX CREA
0.5 % | CUTANEOUS | 0 refills | Status: AC
Start: 2015-10-27 — End: 2015-11-02

## 2015-10-27 MED ORDER — METHYLPREDNISOLONE SODIUM SUCC 125 MG IJ SOLR
125 MG | Freq: Once | INTRAMUSCULAR | Status: AC
Start: 2015-10-27 — End: 2015-10-26
  Administered 2015-10-27: 01:00:00 125 mg via INTRAVENOUS

## 2015-10-27 MED ORDER — ONDANSETRON 4 MG PO TBDP
4 MG | ORAL_TABLET | Freq: Three times a day (TID) | ORAL | 0 refills | Status: DC | PRN
Start: 2015-10-27 — End: 2015-11-23

## 2015-10-27 MED ORDER — MORPHINE SULFATE (PF) 4 MG/ML IV SOLN
4 MG/ML | Freq: Once | INTRAVENOUS | Status: DC
Start: 2015-10-27 — End: 2015-10-27

## 2015-10-27 MED ORDER — SODIUM CHLORIDE 0.9 % IV BOLUS
0.9 % | Freq: Once | INTRAVENOUS | Status: AC
Start: 2015-10-27 — End: 2015-10-26
  Administered 2015-10-27: 01:00:00 1000 mL via INTRAVENOUS

## 2015-10-27 MED ORDER — DICYCLOMINE HCL 10 MG PO CAPS
10 MG | ORAL_CAPSULE | Freq: Four times a day (QID) | ORAL | Status: DC | PRN
Start: 2015-10-27 — End: 2015-11-23

## 2015-10-27 MED ORDER — ONDANSETRON HCL 4 MG/2ML IJ SOLN
4 MG/2ML | Freq: Once | INTRAMUSCULAR | Status: AC
Start: 2015-10-27 — End: 2015-10-26
  Administered 2015-10-27: 01:00:00 4 mg via INTRAVENOUS

## 2015-10-27 MED ORDER — IOVERSOL 68 % IV SOLN
68 % | Freq: Once | INTRAVENOUS | Status: AC | PRN
Start: 2015-10-27 — End: 2015-10-26
  Administered 2015-10-27: 03:00:00 120 mL via INTRAVENOUS

## 2015-10-27 MED FILL — MORPHINE SULFATE (PF) 4 MG/ML IV SOLN: 4 MG/ML | INTRAVENOUS | Qty: 1

## 2015-10-27 MED FILL — ONDANSETRON HCL 4 MG/2ML IJ SOLN: 4 MG/2ML | INTRAMUSCULAR | Qty: 2

## 2015-10-27 MED FILL — METHYLPREDNISOLONE SODIUM SUCC 125 MG IJ SOLR: 125 MG | INTRAMUSCULAR | Qty: 125

## 2015-10-30 ENCOUNTER — Ambulatory Visit
Admit: 2015-10-30 | Discharge: 2015-10-30 | Payer: PRIVATE HEALTH INSURANCE | Attending: Internal Medicine | Primary: Internal Medicine

## 2015-10-30 DIAGNOSIS — R1032 Left lower quadrant pain: Secondary | ICD-10-CM

## 2015-10-30 NOTE — Progress Notes (Signed)
[place] [facility]    HISTORY OF PRESENT ILLNESS:    Candice Yates is a 25 y.o. year old female here for new patient evaluation. She has been having bad pains left lower abdomen for 4-5 months. Went to ED multiple times- nothing was found. Lasts 1-2 days. Feeling like a stabbing. No fevers or chills. Lost some weight. No melena. Has constipation. She has not seen gyn in 2 years. She did have painful menstruation before and BCP did not help. She does have serious issues with constipation.       ALLERGIES:    Allergies   Allergen Reactions   ??? Aspirin    ??? Clindamycin/Lincomycin    ??? Flagyl [Metronidazole]    ??? Pcn [Penicillins]        PAST MEDICAL HISTORY:       Diagnosis Date   ??? PMDD (premenstrual dysphoric disorder)        SURGICAL HISTORY:   History reviewed. No pertinent surgical history.      MEDICATIONS:   Current Outpatient Prescriptions   Medication Sig Dispense Refill   ??? ondansetron (ZOFRAN ODT) 4 MG disintegrating tablet Take 1 tablet by mouth every 8 hours as needed for Nausea or Vomiting 24 tablet 0   ??? dicyclomine (BENTYL) 10 MG capsule Take 2 capsules by mouth 4 times daily as needed (pain) 20 capsule none   ??? triamcinolone (ARISTOCORT) 0.5 % cream Apply topically 3 times daily. 45 g 0   ??? albuterol sulfate HFA (PROVENTIL HFA) 108 (90 BASE) MCG/ACT inhaler Inhale 2 puffs into the lungs every 4 hours as needed for Wheezing 1 Inhaler 1     No current facility-administered medications for this visit.            Allergies   Allergen Reactions   ??? Aspirin    ??? Clindamycin/Lincomycin    ??? Flagyl [Metronidazole]    ??? Pcn [Penicillins]            Family History   Problem Relation Age of Onset   ??? Hypertension Mother    ??? Diabetes Father        Social History     Social History   ??? Marital status: Single     Spouse name: N/A   ??? Number of children: 0   ??? Years of education: N/A     Occupational History   ??? active worker      [employer]     Social History Main Topics   ??? Smoking status: Current Every  Day Smoker     Packs/day: 0.10     Types: Cigars   ??? Smokeless tobacco: Never Used   ??? Alcohol use Yes      Comment: socially   ??? Drug use: No   ??? Sexual activity: Yes     Partners: Female     Other Topics Concern   ??? Not on file     Social History Narrative           REVIEW OF SYSTEMS:  Constitutional: Denies no weight loss, fever, night sweats and feels well  Skin: Denies rash, itching, bruising  EENT: Denies hearing loss, tinnitus and earaches   Cardiovascular:  Denies palpitations and chest pain  Respiration:  Denies cough , dyspnea at rest  and wheezing , apnea or choking.  Gastrointestinal:left lower quadrant abdominal pain as above, constipation is present  Musculoskeletal: Denies myalgias, arthralgias and joint swelling  Neurological: Denies focal weakness, numbness/tingling, confusion, memory loss  Psychological: Denies anxiety, depression  Endocrine:  Denies heat intolerance and cold intolerance  Hematopoietic/lymphatic: Denies bleeding problems, bruising, jaundice and swollen lymph nodes    Vitals:    10/30/15 1505   BP: 120/80   Pulse: 76       PHYSICAL EXAMINATION:  Constitutional: Appears well, no distress  EENT: PERRLA, EOMI, neck supple with midline trachea and thyroid without masses  Respiratory: clear to auscultation, no wheezes or rales and unlabored breathing  Cardiovascular: regular rate and rhythm, normal S1, S2, no murmur noted, 2+ pulses throughout and capillary Refill less than 2 seconds  Abdomen: soft, there is mild LLQ abdominal tenderness present  Musculoskeletal: no joint tenderness, deformity or swelling  Extremities:  peripheral pulses normal, no pedal edema, no clubbing or cyanosis  Skin:  warm and dry, no hyperpigmentation, vitiligo, or suspicious lesions  Neurological/Psychiatric: cranial nerves 2-12 normal, gross motor exam normal by observation, DTR normal for age, sensory exam normal        IMPRESSION:       Candice Yates was seen today for established new doctor and ed  follow-up.    Diagnoses and all orders for this visit:    LLQ abdominal pain    Mild intermittent asthma without complication    Seasonal allergic rhinitis due to pollen    BMI 50.0-59.9, adult (HCC)    Tobacco abuse    recent labs, ct findings noted  My thoughts are IBS vs constipation related pain  Also see gyn dr hill to rule gyn issues  She is going to try stool softeners and bentyl  May have to try IBS drugs if she is no better by next visit   ?scopes              Return in about 1 month (around 11/29/2015).    Loyola MastSudhir K Abigale Dorow M.D.  10/30/2015

## 2015-11-23 ENCOUNTER — Inpatient Hospital Stay
Admit: 2015-11-23 | Discharge: 2015-11-23 | Disposition: A | Discharge status: Home / Self Care | Attending: Emergency Medicine

## 2015-11-23 ENCOUNTER — Emergency Department: Admit: 2015-11-23 | Primary: Internal Medicine

## 2015-11-23 DIAGNOSIS — R519 Headache, unspecified: Secondary | ICD-10-CM

## 2015-11-23 LAB — POC PREGNANCY UR-QUAL: Preg Test, Ur: NEGATIVE

## 2015-11-23 MED ORDER — DIPHENHYDRAMINE HCL 50 MG/ML IJ SOLN
50 MG/ML | Freq: Once | INTRAMUSCULAR | Status: AC
Start: 2015-11-23 — End: 2015-11-23
  Administered 2015-11-23: 18:00:00 50 mg via INTRAVENOUS

## 2015-11-23 MED ORDER — KETOROLAC TROMETHAMINE 30 MG/ML IJ SOLN
30 MG/ML | Freq: Once | INTRAMUSCULAR | Status: AC
Start: 2015-11-23 — End: 2015-11-23
  Administered 2015-11-23: 18:00:00 30 mg via INTRAVENOUS

## 2015-11-23 MED ORDER — METOCLOPRAMIDE HCL 5 MG/ML IJ SOLN
5 MG/ML | Freq: Once | INTRAMUSCULAR | Status: AC
Start: 2015-11-23 — End: 2015-11-23
  Administered 2015-11-23: 18:00:00 10 mg via INTRAVENOUS

## 2015-11-23 MED ORDER — SODIUM CHLORIDE 0.9 % IV BOLUS
0.9 % | Freq: Once | INTRAVENOUS | Status: AC
Start: 2015-11-23 — End: 2015-11-23
  Administered 2015-11-23: 18:00:00 1000 mL via INTRAVENOUS

## 2015-11-23 MED ORDER — KETOROLAC TROMETHAMINE 10 MG PO TABS
10 MG | ORAL_TABLET | Freq: Four times a day (QID) | ORAL | 0 refills | Status: DC | PRN
Start: 2015-11-23 — End: 2015-12-02

## 2015-11-23 MED FILL — DIPHENHYDRAMINE HCL 50 MG/ML IJ SOLN: 50 MG/ML | INTRAMUSCULAR | Qty: 1

## 2015-11-23 MED FILL — METOCLOPRAMIDE HCL 5 MG/ML IJ SOLN: 5 MG/ML | INTRAMUSCULAR | Qty: 2

## 2015-11-23 MED FILL — KETOROLAC TROMETHAMINE 30 MG/ML IJ SOLN: 30 MG/ML | INTRAMUSCULAR | Qty: 1

## 2015-11-23 NOTE — ED Provider Notes (Signed)
HPI Comments: Patient is a 25 year old female presenting with chief complaint of headache. Patient states that since she was younger she has experienced intermittent headaches that will last for several weeks at a time and then spontaneously resolved. She states that this may happen 3-4 times in a year. She typically does takes over-the-counter medication such as ibuprofen with some relief of his symptoms until the symptoms resolve on their own over time. Patient states that she has been experiencing a current headache for the last 4 weeks, came to the ER for further evaluation. Patient denies any other symptoms or injuries, denies head injuries, loss of consciousness, visual changes, focal numbness or weakness, fevers, chills, chest pain, shortness of breath, palpitations, nausea, vomiting. Symptoms have been constant since onset, no particular exacerbating or relieving factors for the symptoms. No treatment prior to coming into the ER.      The history is provided by the patient.       Review of Systems   Constitutional: Negative for chills, diaphoresis, fatigue and fever.   HENT: Negative for congestion, ear pain, postnasal drip, rhinorrhea, sore throat and trouble swallowing.    Respiratory: Negative for cough, chest tightness, shortness of breath and wheezing.    Cardiovascular: Negative for chest pain and leg swelling.   Gastrointestinal: Negative for abdominal pain, blood in stool, constipation, diarrhea, nausea and vomiting.   Genitourinary: Negative for dysuria, flank pain, frequency, hematuria and urgency.   Skin: Negative for rash and wound.   Neurological: Positive for headaches. Negative for dizziness, syncope, weakness, light-headedness and numbness.   Psychiatric/Behavioral: Negative for confusion and decreased concentration.   All other systems reviewed and are negative.      Physical Exam   Constitutional: She is oriented to person, place, and time. She appears well-developed and well-nourished.  She is active.  Non-toxic appearance. No distress.   Patient laying in bed, smiling and pleasant during conversation, joking with nursing and myself, NAD   HENT:   Head: Normocephalic and atraumatic.   Right Ear: Hearing and tympanic membrane normal.   Left Ear: Hearing and tympanic membrane normal.   Nose: Nose normal.   Mouth/Throat: Uvula is midline, oropharynx is clear and moist and mucous membranes are normal. Mucous membranes are not pale and not dry.   Eyes: Conjunctivae and EOM are normal. Pupils are equal, round, and reactive to light.   Neck: Normal range of motion. Neck supple.   Cardiovascular: Normal rate, regular rhythm, S1 normal, S2 normal, normal heart sounds and intact distal pulses.    No murmur heard.  Pulses:       Radial pulses are 2+ on the right side, and 2+ on the left side.        Dorsalis pedis pulses are 2+ on the right side, and 2+ on the left side.   Pulmonary/Chest: Effort normal and breath sounds normal. No respiratory distress. She has no decreased breath sounds. She has no wheezes. She has no rhonchi.   Abdominal: Soft. Bowel sounds are normal. She exhibits no distension. There is no tenderness.   Musculoskeletal: Normal range of motion. She exhibits no edema.   Neurological: She is alert and oriented to person, place, and time. She has normal strength. She is not disoriented. No cranial nerve deficit or sensory deficit. She exhibits normal muscle tone. Coordination normal. GCS eye subscore is 4. GCS verbal subscore is 5. GCS motor subscore is 6.   Skin: Skin is warm, dry and intact. No rash noted. No  erythema.   Psychiatric: She has a normal mood and affect. Her speech is normal and behavior is normal.   Nursing note and vitals reviewed.      Procedures    MDM    --------------------------------------------- PAST HISTORY ---------------------------------------------  Past Medical History:  has a past medical history of PMDD (premenstrual dysphoric disorder).    Past Surgical  History:  has no past surgical history on file.    Social History:  reports that she has been smoking Cigars.  She has been smoking about 0.10 packs per day. She has never used smokeless tobacco. She reports that she drinks alcohol. She reports that she does not use illicit drugs.    Family History: family history includes Diabetes in her father; Hypertension in her mother.     The patient???s home medications have been reviewed.    Allergies: Aspirin; Clindamycin/lincomycin; Flagyl [metronidazole]; and Pcn [penicillins]    -------------------------------------------------- RESULTS -------------------------------------------------  Labs Reviewed   POC PREGNANCY UR-QUAL - Normal     CT Head WO Contrast   Final Result   No evidence for acute intracranial hemorrhage, mass or mass effect.                 Ct Abdomen Pelvis W Iv Contrast Additional Contrast? None    Result Date: 10/26/2015  EXAM:   CT Abdomen and Pelvis With Intravenous Contrast CLINICAL HISTORY:   25 years old, female; Pain; Abdominal pain TECHNIQUE:   Axial computed tomography images of the abdomen and pelvis with intravenous contrast.  This CT exam was performed using one or more of the following dose reduction techniques:  automated exposure control, adjustment of the mA and/or kV according to patient size, and/or use of iterative reconstruction technique.   Coronal and sagittal reformatted images were created and reviewed. CONTRAST:   120 mL of Optiray 320 administered intravenously. COMPARISON:   No relevant prior studies available. FINDINGS:   Lower thorax: No acute findings.  ABDOMEN:   Liver:  Unremarkable.  No mass.   Gallbladder and bile ducts:  Unremarkable.  No calcified stones.  No ductal dilation.   Pancreas:  Unremarkable.  No mass.  No ductal dilation.   Spleen:  Unremarkable.  No splenomegaly.   Adrenals:  Unremarkable.  No mass.   Kidneys and ureters:  There is no evidence for ureteral obstruction.   Stomach and bowel:  Unremarkable.  No  obstruction.  No mucosal thickening.   Appendix:  The appendix is visualized and is normal in configuration.  PELVIS:   Bladder:  Unremarkable.  No mass.   Reproductive:  Unremarkable as visualized.  ABDOMEN and PELVIS:   Intraperitoneal space:  Unremarkable.  No free air.  No significant fluid collection.   Bones/joints:  No acute fracture.  No dislocation.   Soft tissues:  Some haziness is seen within the subcutaneous fat left lower anterior abdominal wall possibly representing some bruising.   Vasculature:  Unremarkable.  No abdominal aortic aneurysm.   Lymph nodes:  Unremarkable.  No enlarged lymph nodes.       No acute findings. This Final report was electronically signed by Josph MachoKaler, Lawrence, MD on 26 Oct 2015 11:11 PM EDT.      ------------------------- NURSING NOTES AND VITALS REVIEWED ---------------------------   The nursing notes within the ED encounter and vital signs as below have been reviewed.   BP (!) 140/113   Pulse 91   Temp 96.3 ??F (35.7 ??C) (Temporal)    Resp 17   LMP  11/08/2015   SpO2 98%  Oxygen Saturation Interpretation: Normal      ------------------------------------------ PROGRESS NOTES ------------------------------------------  Time: 3:00 PM  Patient resting in bed, no acute distress at this time. States that her headache is significantly resolved after treatment in the ER. Reassuring evaluation with negative head CT. We will provide patient with follow-up with neurology also instructed her to contact her primary care physician for further follow-up for recurrent headaches. Discussed signs and symptoms to prompt immediate return visit to the nearest ER, patient without further questions or concerns.          I have spoken with the patient and discussed today???s results, in addition to providing specific details for the plan of care and counseling regarding the diagnosis and prognosis.  Their questions are answered at this time and they are agreeable with the  plan.      --------------------------------- ADDITIONAL PROVIDER NOTES ---------------------------------     DIAGNOSIS:    1. Acute nonintractable headache, unspecified headache type          Discharge Medication List as of 11/23/2015  3:09 PM      START taking these medications    Details   ketorolac (TORADOL) 10 MG tablet Take 1 tablet by mouth every 6 hours as needed for Pain, Disp-15 tablet, R-0Print              This patient is stable for discharge.  I have shared the specific conditions for return, as well as the importance of follow-up.           Estill Bamberg, DO  Resident  11/23/15 [phone]

## 2015-12-02 ENCOUNTER — Ambulatory Visit
Admit: 2015-12-02 | Discharge: 2015-12-02 | Payer: PRIVATE HEALTH INSURANCE | Attending: Internal Medicine | Primary: Internal Medicine

## 2015-12-02 DIAGNOSIS — K589 Irritable bowel syndrome without diarrhea: Secondary | ICD-10-CM

## 2015-12-02 NOTE — Progress Notes (Signed)
HPI:  Patient comes in today for follow up  Tried bentyl  Abdominal pain is better    Chief Complaint   Patient presents with   ??? Follow-up         Review of Systems  General: No fevers, chills, change in weight  HEENT: No double vision, blurry vision, runny nose, sore throat, tinnitus  Cardio: No chest pain, palpitations, DOE, edema, PND  Pulmonary: No cough, hemoptysis, SOB  GI: No nausea, vomiting, dysphagia, odynophagia, diarrhea, melena, hematochezia  GU: No dysuria, hematuria, urgency, incontinence  Musculoskeletal: No muscle or joint aches, no joint swelling  Neuro:No dizziness/lightheadedness, no seizures  Endocrine: no weight gain, no weight loss      PE:  VS:  BP (!) 146/100   Pulse 92   Ht 5\' 5"  (1.651 m)   Wt (!) 345 lb (156.5 kg)   LMP 11/08/2015   BMI 57.41 kg/m2  General:  Alert and oriented, NAD  HEENT:  TMs, EACs and auricles are normal, PERL, EOMI, Conjunctivae clear       Throat currently clear.  NECK:  Supple without adenopathy or thyromegaly  LUNGS:  CTA all fields  HEART:  RRR without M, R, or G  ABDOMEN:  Soft and nontender without palpable abnormalities  EXTREMITIES:  Without clubbing, cyanosis, or edema    Patient Active Problem List   Diagnosis   ??? Mild intermittent asthma without complication   ??? Seasonal allergic rhinitis due to pollen   ??? BMI 50.0-59.9, adult (HCC)   ??? Tobacco abuse         Assessment/Plan:  Clarene Yates was seen today for follow-up.    Diagnoses and all orders for this visit:    Irritable bowel syndrome, unspecified type  ?better with bentyl  She had normal blood work and a negative ct abdomen recently  Await gyn evaluation  Consider scopes vs surgical evaluation if pain worsens      BMI 50.0-59.9, adult [facility](HCC)  Patient requesting phentermine  I told her I don't prescribe this for weight loss  She will research weight loss procedures and let me know if she wants to see bariatric surgery.        Return in about 3 months (around 03/03/2016).

## 2016-01-10 ENCOUNTER — Inpatient Hospital Stay: Primary: Internal Medicine

## 2016-01-10 ENCOUNTER — Ambulatory Visit: Admit: 2016-01-10 | Primary: Internal Medicine

## 2016-01-10 LAB — NM MYOCARDIAL SPECT REST MULTI: Left Ventricular Ejection Fraction: 59

## 2016-01-10 MED ORDER — TECHNETIUM TC 99M SESTAMIBI - CARDIOLITE
Freq: Once | Status: AC
Start: 2016-01-10 — End: 2016-01-10
  Administered 2016-01-10: 14:00:00 35 via INTRAVENOUS

## 2016-01-10 MED ORDER — TECHNETIUM TC 99M SESTAMIBI - CARDIOLITE
Freq: Once | Status: AC
Start: 2016-01-10 — End: 2016-01-10
  Administered 2016-01-10: 13:00:00 10 via INTRAVENOUS

## 2016-02-04 NOTE — Telephone Encounter (Signed)
Dr. Cropp read sleep study, however he is not following this patient. Results faxed to Dr. Facemyer to discuss with patient.

## 2016-02-25 NOTE — Telephone Encounter (Signed)
Dr. Cropp read sleep study, however he is not following this patient. Results faxed to Dr. Facemyer to discuss with patient.

## 2016-03-03 ENCOUNTER — Encounter: Attending: Internal Medicine | Primary: Internal Medicine

## 2017-09-02 ENCOUNTER — Encounter (HOSPITAL_COMMUNITY): Payer: Self-pay | Admitting: Emergency Medicine

## 2017-09-02 ENCOUNTER — Other Ambulatory Visit: Payer: Self-pay

## 2017-09-02 ENCOUNTER — Ambulatory Visit (HOSPITAL_COMMUNITY)
Admission: EM | Admit: 2017-09-02 | Discharge: 2017-09-02 | Disposition: A | Discharge status: Home / Self Care | Payer: Self-pay | Attending: Internal Medicine | Admitting: Internal Medicine

## 2017-09-02 DIAGNOSIS — L03111 Cellulitis of right axilla: Secondary | ICD-10-CM

## 2017-09-02 MED ORDER — CEPHALEXIN 500 MG PO CAPS: 500.0000 mg | ORAL_CAPSULE | Freq: Four times a day (QID) | ORAL | 0 refills | Status: AC

## 2017-09-02 MED ORDER — SULFAMETHOXAZOLE-TRIMETHOPRIM 800-160 MG PO TABS: 1.0000 | ORAL_TABLET | Freq: Two times a day (BID) | ORAL | 0 refills | Status: AC

## 2017-09-02 NOTE — ED Triage Notes (Signed)
Complains of a rash that started 2 days ago.  Rash is under right arm.  Area is sore to touch.

## 2017-09-02 NOTE — ED Provider Notes (Signed)
[facility]    CSN: [id number] Arrival date & time: 09/02/17  1927     History   Chief Complaint Chief Complaint  Patient presents with  . Rash    HPI Madison Cantu is a 27 y.o. female presenting today with a rash to her right armpit.  This is been there for the past 2 days and has been very painful and tender.  Denies history of abscesses to this area.  Does note that she has a history of an abscess to her right lower extremity that was positive for MRSA.  She denies any itching related to the rash.  Denies any new deodorants, soaps, lotions or detergents.  HPI  History reviewed. No pertinent past medical history.  There are no active problems to display for this patient.   History reviewed. No pertinent surgical history.  OB History   None      Home Medications    Prior to Admission medications   Medication Sig Start Date End Date Taking? Authorizing Provider  cephALEXin (KEFLEX) 500 MG capsule Take 1 capsule (500 mg total) by mouth 4 (four) times daily for 7 days. 09/02/17 09/09/17  Wieters, Hallie C, PA-C  sulfamethoxazole-trimethoprim (BACTRIM DS,SEPTRA DS) 800-160 MG tablet Take 1 tablet by mouth 2 (two) times daily for 7 days. 09/02/17 09/09/17  Wieters, Junius CreamerHallie C, PA-C    Family History Family History  Problem Relation Age of Onset  . Hypertension Mother   . Hypertension Father   . Hypertension Brother     Social History Social History   Tobacco Use  . Smoking status: Never Smoker  Substance Use Topics  . Alcohol use: No  . Drug use: Never     Allergies   Ciprocinonide [fluocinolone]; Penicillins; and Aspirin   Review of Systems Review of Systems  Constitutional: Negative for fatigue and fever.  Respiratory: Negative for shortness of breath.   Cardiovascular: Negative for chest pain.  Gastrointestinal: Negative for abdominal pain, nausea and vomiting.  Musculoskeletal: Negative for myalgias and neck pain.  Skin: Positive for  color change and rash. Negative for wound.  Neurological: Negative for dizziness, syncope, weakness and light-headedness.     Physical Exam Triage Vital Signs ED Triage Vitals  Enc Vitals Group     BP 09/02/17 2026 128/71     Pulse Rate 09/02/17 2026 65     Resp 09/02/17 2026 (!) 26     Temp 09/02/17 2026 (!) 97.1 F (36.2 C)     Temp Source 09/02/17 2026 Oral     SpO2 09/02/17 2026 100 %     Weight --      Height --      Head Circumference --      Peak Flow --      Pain Score 09/02/17 2024 7     Pain Loc --      Pain Edu? --      Excl. in GC? --    No data found.  Updated Vital Signs BP 128/71 (BP Location: Left Arm) Comment (BP Location): regular cuff on left forearm  Pulse 65   Temp (!) 97.1 F (36.2 C) (Oral)   Resp (!) 26   LMP 08/05/2017   SpO2 100%   Visual Acuity Right Eye Distance:   Left Eye Distance:   Bilateral Distance:    Right Eye Near:   Left Eye Near:    Bilateral Near:     Physical Exam  Constitutional: She appears well-developed and well-nourished.  No distress.  HENT:  Head: Normocephalic and atraumatic.  Eyes: Conjunctivae are normal.  Neck: Neck supple.  Cardiovascular: Normal rate and regular rhythm.  No murmur heard. Pulmonary/Chest: Effort normal. No respiratory distress.  Musculoskeletal: She exhibits no edema.  Neurological: She is alert.  Skin: Skin is warm and dry. There is erythema.  See picture below, redness and loss of epidermal layer to right axilla, tenderness to palpation, no induration or fluctuance.  Psychiatric: She has a normal mood and affect.  Nursing note and vitals reviewed.      UC Treatments / Results  Labs (all labs ordered are listed, but only abnormal results are displayed) Labs Reviewed - No data to display  EKG None Radiology No results found.  Procedures Procedures (including critical care time)  Medications Ordered in UC Medications - No data to display   Initial Impression /  Assessment and Plan / UC Course  I have reviewed the triage vital signs and the nursing notes.  Pertinent labs & imaging results that were available during my care of the patient were reviewed by me and considered in my medical decision making (see chart for details).     Vital signs stable.  Rash for 2 days.  Will treat for possible cellulitis/early abscess.  Will begin Bactrim and Keflex.  Advised patient to return if rash spreading despite antibiotics, or if increasing swelling or size. Discussed strict return precautions. Patient verbalized understanding and is agreeable with plan.   Final Clinical Impressions(s) / UC Diagnoses   Final diagnoses:  Cellulitis of axilla, right    ED Discharge Orders        Ordered    sulfamethoxazole-trimethoprim (BACTRIM DS,SEPTRA DS) 800-160 MG tablet  2 times daily     09/02/17 2109    cephALEXin (KEFLEX) 500 MG capsule  4 times daily     09/02/17 2109       Controlled Substance Prescriptions [place] Controlled Substance Registry consulted? Not Applicable   Lew Dawes, [place] 09/02/17 2137

## 2017-09-02 NOTE — Discharge Instructions (Signed)
Please begin bactrim and keflex as prescribed, I am treating for for an infection/cellulitis/early abscess

## 2017-10-21 ENCOUNTER — Encounter (HOSPITAL_BASED_OUTPATIENT_CLINIC_OR_DEPARTMENT_OTHER): Payer: Self-pay

## 2017-10-21 ENCOUNTER — Other Ambulatory Visit: Payer: Self-pay

## 2017-10-21 ENCOUNTER — Emergency Department (HOSPITAL_BASED_OUTPATIENT_CLINIC_OR_DEPARTMENT_OTHER)
Admission: EM | Admit: 2017-10-21 | Discharge: 2017-10-21 | Disposition: A | Discharge status: Home / Self Care | Payer: Medicaid - Out of State | Attending: Emergency Medicine | Admitting: Emergency Medicine

## 2017-10-21 DIAGNOSIS — K602 Anal fissure, unspecified: Secondary | ICD-10-CM | POA: Insufficient documentation

## 2017-10-21 DIAGNOSIS — K6289 Other specified diseases of anus and rectum: Secondary | ICD-10-CM | POA: Diagnosis present

## 2017-10-21 HISTORY — DX: Unspecified hemorrhoids: K64.9

## 2017-10-21 LAB — URINALYSIS, ROUTINE W REFLEX MICROSCOPIC
Bilirubin Urine: NEGATIVE
Glucose, UA: NEGATIVE mg/dL
HGB URINE DIPSTICK: NEGATIVE
Ketones, ur: NEGATIVE mg/dL
LEUKOCYTES UA: NEGATIVE
NITRITE: NEGATIVE
PROTEIN: NEGATIVE mg/dL
pH: 6 (ref 5.0–8.0)

## 2017-10-21 LAB — PREGNANCY, URINE: PREG TEST UR: NEGATIVE

## 2017-10-21 MED ORDER — LIDOCAINE (ANORECTAL) 5 % EX CREA: TOPICAL_CREAM | CUTANEOUS | 0 refills | Status: DC

## 2017-10-21 NOTE — ED Triage Notes (Signed)
Pt c/o rectal pain and some blood on her stool when she has a bowel movement, hx of hemorrhoids, also c/o some left side pain, symptoms started a few days ago, denies dysuria or hematuria

## 2017-10-21 NOTE — ED Notes (Signed)
Witness for EDP exam of pt's rectum

## 2017-10-21 NOTE — ED Notes (Signed)
Pt verbalizes understanding of d/c instructions and denies any further need at this time. 

## 2017-10-21 NOTE — ED Provider Notes (Signed)
[facility] Provider Note: Lowella Dell, MD, FACEP  CSN: [id number] MRN: [id number] ARRIVAL: 10/21/17 at 0029 ROOM: [facility]   CHIEF COMPLAINT  Rectal Pain   HISTORY OF PRESENT ILLNESS  10/21/17 1:50 AM Madison Cantu is a 27 y.o. female with a history of hemorrhoids.  She is here with a 2-day history of pain in her anus.  She describes the pain as being severe especially after bowel movements or when sitting.  She has also noticed some bleeding on the toilet tissue.  She has not been using anything to treat her symptoms.  She has been having some left-sided pain but denies dysuria or hematuria.    Past Medical History:  Diagnosis Date  . Hemorrhoids     History reviewed. No pertinent surgical history.  Family History  Problem Relation Age of Onset  . Hypertension Mother   . Hypertension Father   . Hypertension Brother     Social History   Tobacco Use  . Smoking status: Never Smoker  Substance Use Topics  . Alcohol use: No  . Drug use: Never    Prior to Admission medications   Not on File    Allergies Ciprocinonide [fluocinolone]; Clindamycin/lincomycin; Penicillins; and Aspirin   REVIEW OF SYSTEMS  Negative except as noted here or in the History of Present Illness.   PHYSICAL EXAMINATION  Initial Vital Signs Blood pressure (!) 147/98, pulse 84, temperature 98.1 F (36.7 C), temperature source Oral, resp. rate 18, height  (1.626 m), weight (!) 145.2 kg (320 lb), last menstrual period 10/01/2017, SpO2 100 %.  Examination General: Well-developed, obese female in no acute distress; appearance consistent with age of record HENT: normocephalic; atraumatic Eyes: Normal appearance Neck: supple Heart: regular rate and rhythm; no murmurs, rubs or gallops Lungs: clear to auscultation bilaterally Abdomen: soft; nondistended; nontender; bowel sounds present Rectal: Anal fissure at about 5:00 in the lithotomy position Extremities: No  deformity; full range of motion; pulses normal Neurologic: Awake, alert and oriented; motor function intact in all extremities and symmetric; no facial droop Skin: Warm and dry Psychiatric: Normal mood and affect   RESULTS  Summary of this visit's results, reviewed by myself:   EKG Interpretation  Date/Time:    Ventricular Rate:    PR Interval:    QRS Duration:   QT Interval:    QTC Calculation:   R Axis:     Text Interpretation:        Laboratory Studies: Results for orders placed or performed during the hospital encounter of 10/21/17 (from the past 24 hour(s))  Urinalysis, Routine w reflex microscopic     Status: Abnormal   Collection Time: 10/21/17 12:42 AM  Result Value Ref Range   Color, Urine YELLOW YELLOW   APPearance CLOUDY (A) CLEAR   Specific Gravity, Urine >1.030 (H) 1.005 - 1.030   pH 6.0 5.0 - 8.0   Glucose, UA NEGATIVE NEGATIVE mg/dL   Hgb urine dipstick NEGATIVE NEGATIVE   Bilirubin Urine NEGATIVE NEGATIVE   Ketones, ur NEGATIVE NEGATIVE mg/dL   Protein, ur NEGATIVE NEGATIVE mg/dL   Nitrite NEGATIVE NEGATIVE   Leukocytes, UA NEGATIVE NEGATIVE  Pregnancy, urine     Status: None   Collection Time: 10/21/17 12:42 AM  Result Value Ref Range   Preg Test, Ur NEGATIVE NEGATIVE   Imaging Studies: No results found.  ED COURSE and MDM  Nursing notes and initial vitals signs, including pulse oximetry, reviewed.  Vitals:   10/21/17 0039  BP: Marland Kitchen)  147/98  Pulse: 84  Resp: 18  Temp: 98.1 F (36.7 C)  TempSrc: Oral  SpO2: 100%  Weight: (!) 145.2 kg (320 lb)  Height:  (1.626 m)   We will treat with topical lidocaine.  She was advised to increase the fiber in her diet.  PROCEDURES    ED DIAGNOSES     ICD-10-CM   1. Anal fissure K60.2        Greer Wainright, Jonny Ruiz, MD 10/21/17 [phone]

## 2017-11-18 ENCOUNTER — Ambulatory Visit (INDEPENDENT_AMBULATORY_CARE_PROVIDER_SITE_OTHER): Payer: Self-pay | Admitting: Physician Assistant

## 2017-11-18 ENCOUNTER — Other Ambulatory Visit: Payer: Self-pay

## 2017-11-18 ENCOUNTER — Encounter (INDEPENDENT_AMBULATORY_CARE_PROVIDER_SITE_OTHER): Payer: Self-pay | Admitting: Physician Assistant

## 2017-11-18 VITALS — BP 117/88 | HR 77 | Temp 98.3°F | Wt 354.0 lb

## 2017-11-18 DIAGNOSIS — N921 Excessive and frequent menstruation with irregular cycle: Secondary | ICD-10-CM

## 2017-11-18 DIAGNOSIS — K602 Anal fissure, unspecified: Secondary | ICD-10-CM

## 2017-11-18 DIAGNOSIS — R519 Headache, unspecified: Secondary | ICD-10-CM

## 2017-11-18 DIAGNOSIS — J45909 Unspecified asthma, uncomplicated: Secondary | ICD-10-CM

## 2017-11-18 DIAGNOSIS — Z23 Encounter for immunization: Secondary | ICD-10-CM

## 2017-11-18 DIAGNOSIS — R51 Headache: Secondary | ICD-10-CM

## 2017-11-18 DIAGNOSIS — Z09 Encounter for follow-up examination after completed treatment for conditions other than malignant neoplasm: Secondary | ICD-10-CM

## 2017-11-18 DIAGNOSIS — G479 Sleep disorder, unspecified: Secondary | ICD-10-CM

## 2017-11-18 DIAGNOSIS — Z114 Encounter for screening for human immunodeficiency virus [HIV]: Secondary | ICD-10-CM

## 2017-11-18 DIAGNOSIS — Z131 Encounter for screening for diabetes mellitus: Secondary | ICD-10-CM

## 2017-11-18 LAB — POCT GLYCOSYLATED HEMOGLOBIN (HGB A1C): HEMOGLOBIN A1C: 5.4 % (ref 4.0–5.6)

## 2017-11-18 MED ORDER — ACETAMINOPHEN 500 MG PO TABS: 1000.0000 mg | ORAL_TABLET | Freq: Three times a day (TID) | ORAL | 0 refills | Status: AC | PRN

## 2017-11-18 MED ORDER — ALBUTEROL SULFATE HFA 108 (90 BASE) MCG/ACT IN AERS: 2.0000 | INHALATION_SPRAY | Freq: Four times a day (QID) | RESPIRATORY_TRACT | 2 refills | Status: DC | PRN

## 2017-11-18 MED ORDER — NORGESTIM-ETH ESTRAD TRIPHASIC 0.18/0.215/0.25 MG-35 MCG PO TABS: 1.0000 | ORAL_TABLET | Freq: Every day | ORAL | 11 refills | Status: DC

## 2017-11-18 NOTE — Patient Instructions (Addendum)

## 2017-11-18 NOTE — Progress Notes (Signed)
NP has severe headaches daily, cause her to not have an appetite and not sleep  Pt is gaining weight rapidly

## 2017-11-18 NOTE — Progress Notes (Signed)
Subjective:  Patient ID: Madison Cantu, female    DOB: 07/26/1990  Age: 27 y.o. MRN: [id number]  CC: hospital f/u  HPI Madison Cantu is a 27 y.o. female with a medical history of asthma and hemorrhoids presents on hospital follow up for anal fissure on 10/21/17.  Was treated with topical lidocaine and advised to increase fiber in the diet. Says she is doing much better in regards to anal fissure and feels the fissure has resolved.     Main complaint is of headache. Felt in the occiput and bitemporal. Feels like someone is beating her on the top of her head. Headache 2-3 times per week. Headaches last "one week or more". Takes ibuprofen nearly everyday. Has to rest in a dark room but has to take ibuprofen to get to sleep. Has occasional left sided upper extremity numbness and right lower extremity numbness. Reports having had a sleep study done and prescribed a CPAP but never used the CPAP because she moved from [place] to [place].      Also has severe menstrual cramps and vaginal bleeding. Also experiences irregular menstrual period; sometimes twice per month.          Outpatient Medications Prior to Visit  Medication Sig Dispense Refill  . Lidocaine, Anorectal, 5 % CREA Apply to anal fissure as needed for pain. 30 g 0   No facility-administered medications prior to visit.      ROS Review of Systems  Constitutional: Negative for chills, fever and malaise/fatigue.  Eyes: Negative for blurred vision.  Respiratory: Negative for shortness of breath.   Cardiovascular: Negative for chest pain and palpitations.  Gastrointestinal: Negative for abdominal pain and nausea.  Genitourinary: Negative for dysuria and hematuria.  Musculoskeletal: Negative for joint pain and myalgias.  Skin: Negative for rash.  Neurological: Positive for headaches. Negative for dizziness, tingling, tremors, sensory change, speech change, focal weakness, seizures, loss of consciousness and weakness.       LUE and RLE  numbness  Psychiatric/Behavioral: Negative for depression. The patient is not nervous/anxious.     Objective:   Vitals:   11/18/17 1115  BP: 117/88  Pulse: 77  Temp: 98.3 F (36.8 C)  TempSrc: Oral  SpO2: 99%  Weight: (!) 354 lb (160.6 kg)         Physical Exam  Constitutional: She is oriented to person, place, and time.  NAD, obese, polite  HENT:  Head: Normocephalic and atraumatic.  Eyes: Conjunctivae are normal. No scleral icterus.  Neck: Normal range of motion. Thyromegaly: difficult to assess due to body habitus.  Difficult to assess for thyromegaly or nodules due to body habitus  Cardiovascular: Normal rate, regular rhythm, normal heart sounds and intact distal pulses. Exam reveals no gallop and no friction rub.  No murmur heard. Lower extremity distal pulses difficult to assess due to body habitus.  Pulmonary/Chest: Effort normal. No respiratory distress. She has wheezes (RML wheeze). She has no rales.  Abdominal:  Difficult to assess for abdominal mass due to body habitus   Musculoskeletal:  Bilateral non-pitting edema of the lower extremity  Neurological: She is alert and oriented to person, place, and time. She displays normal reflexes. No cranial nerve deficit or sensory deficit. She exhibits normal muscle tone. Coordination normal.  Romberg negative  Skin: Skin is warm and dry. No rash noted.  Psychiatric: She has a normal mood and affect. Her behavior is normal. Thought content normal.  Vitals reviewed.    Assessment & Plan:  1. Hospital discharge follow-up - Notes reviewed  2. Anal fissure - Resolved  3. Screening for HIV (human immunodeficiency virus) - HIV antibody  4. Need for Tdap vaccination - Tdap vaccine greater than or equal to 7yo IM  5. Screening for diabetes mellitus - HgB A1c 5.4%  6. Intractable headache, unspecified chronicity pattern, unspecified headache type - Comprehensive metabolic panel - Nocturnal polysomnography  (NPSG); Future  7. Menometrorrhagia - CBC with Differential - Norgestimate-Ethinyl Estradiol Triphasic (ORTHO TRI-CYCLEN, 28,) 0.18/0.215/0.25 MG-35 MCG tablet; Take 1 tablet by mouth daily.  Dispense: 1 Package; Refill: 11  8. Morbid obesity (HCC) - TSH - Nocturnal polysomnography (NPSG); Future  9. Mild asthma without complication, unspecified whether persistent - albuterol (PROVENTIL HFA;VENTOLIN HFA) 108 (90 Base) MCG/ACT inhaler; Inhale 2 puffs into the lungs every 6 (six) hours as needed for wheezing or shortness of breath.  Dispense: 1 Inhaler; Refill: 2  10. Sleep disturbance - Nocturnal polysomnography (NPSG); Future   Meds ordered this encounter  Medications  . albuterol (PROVENTIL HFA;VENTOLIN HFA) 108 (90 Base) MCG/ACT inhaler    Sig: Inhale 2 puffs into the lungs every 6 (six) hours as needed for wheezing or shortness of breath.    Dispense:  1 Inhaler    Refill:  2    Order Specific Question:   Supervising Provider    Answer:   Hoy RegisterNEWLIN, ENOBONG [[id number]]  . Norgestimate-Ethinyl Estradiol Triphasic (ORTHO TRI-CYCLEN, 28,) 0.18/0.215/0.25 MG-35 MCG tablet    Sig: Take 1 tablet by mouth daily.    Dispense:  1 Package    Refill:  11    Order Specific Question:   Supervising Provider    Answer:   Hoy RegisterNEWLIN, ENOBONG [[id number]]    Follow-up: Return in about 6 weeks (around 12/30/2017) for Headaches, sleep study result.   Loletta Specteroger David Gomez PA

## 2017-12-30 ENCOUNTER — Ambulatory Visit (INDEPENDENT_AMBULATORY_CARE_PROVIDER_SITE_OTHER): Payer: Medicaid - Out of State | Admitting: Physician Assistant

## 2018-03-11 ENCOUNTER — Emergency Department (HOSPITAL_COMMUNITY): Payer: Medicaid - Out of State

## 2018-03-11 ENCOUNTER — Other Ambulatory Visit: Payer: Self-pay

## 2018-03-11 ENCOUNTER — Emergency Department (HOSPITAL_COMMUNITY)
Admission: EM | Admit: 2018-03-11 | Discharge: 2018-03-11 | Disposition: A | Discharge status: Home / Self Care | Payer: Medicaid - Out of State | Attending: Emergency Medicine | Admitting: Emergency Medicine

## 2018-03-11 ENCOUNTER — Encounter (HOSPITAL_COMMUNITY): Payer: Self-pay | Admitting: Emergency Medicine

## 2018-03-11 DIAGNOSIS — R079 Chest pain, unspecified: Secondary | ICD-10-CM | POA: Insufficient documentation

## 2018-03-11 DIAGNOSIS — Z5321 Procedure and treatment not carried out due to patient leaving prior to being seen by health care provider: Secondary | ICD-10-CM | POA: Diagnosis not present

## 2018-03-11 DIAGNOSIS — R0789 Other chest pain: Secondary | ICD-10-CM

## 2018-03-11 LAB — CBC
HCT: 43.6 % (ref 36.0–46.0)
Hemoglobin: 13.2 g/dL (ref 12.0–15.0)
MCH: 27.4 pg (ref 26.0–34.0)
MCHC: 30.3 g/dL (ref 30.0–36.0)
MCV: 90.6 fL (ref 80.0–100.0)
NRBC: 0 % (ref 0.0–0.2)
PLATELETS: 313 10*3/uL (ref 150–400)
RBC: 4.81 MIL/uL (ref 3.87–5.11)
RDW: 14 % (ref 11.5–15.5)
WBC: 7.7 10*3/uL (ref 4.0–10.5)

## 2018-03-11 LAB — BASIC METABOLIC PANEL
ANION GAP: 7 (ref 5–15)
BUN: 8 mg/dL (ref 6–20)
CALCIUM: 9 mg/dL (ref 8.9–10.3)
CO2: 23 mmol/L (ref 22–32)
CREATININE: 0.84 mg/dL (ref 0.44–1.00)
Chloride: 109 mmol/L (ref 98–111)
GFR calc non Af Amer: >60 mL/min (ref >60)
Glucose, Bld: 95 mg/dL (ref 70–99)
Potassium: 4.2 mmol/L (ref 3.5–5.1)
SODIUM: 139 mmol/L (ref 135–145)

## 2018-03-11 LAB — I-STAT BETA HCG BLOOD, ED (MC, WL, AP ONLY)

## 2018-03-11 LAB — I-STAT TROPONIN, ED: Troponin i, poc: 0 ng/mL (ref 0.00–0.08)

## 2018-03-11 NOTE — ED Notes (Signed)
Called pt x2 for room, no response. 

## 2018-03-11 NOTE — ED Notes (Addendum)
Pt up to desk.  States she has to go to work and needs to be checked out.  This RN encouraged her to stay and states we can provide a work note for her missing work.  Patient refused and would like to be d/c'd.  Will d/c.  Return precautions reviewed.

## 2018-03-11 NOTE — ED Notes (Signed)
Patient still in lobby, asking about her wait time.  Will un do d/c.

## 2018-03-11 NOTE — ED Triage Notes (Signed)
Pt reports intermittent left cp that started two days ago with worsening today of radiating numbness to left arm and leg. Pain upon palpation. Also reports SOB and lightheadedness. Respirations unlabored in triage.

## 2018-03-11 NOTE — ED Provider Notes (Cosign Needed)
Patient placed in Quick Look pathway, seen and evaluated   Chief Complaint: CP  HPI:   CP intermittently for 2 days. Left sided, radiates to LUE and sometimes left leg. SOB and lightheadedness. Worsening today. Worsening with deep breaths. Denies leg swelling, recent travel or surgeries, OCPs, hemoptysis, or h/x of DVT/PE. No fevers, cough, nausea or vomiting. Current smoker.   ROS: +CP, SOB, lightheadedness  Physical Exam:   Gen: No distress  Neuro: Awake and Alert  Skin: Warm    Focused Exam: Tachycardic, examination somewhat limited due to body habitus though no adventitious breath sounds noted. 2+ radial and DP/PT pulses bilaterally, Homans sign absent bilaterally, no lower extremity edema, no palpable cords, compartments are soft.  Abdomen soft and nontender.   Initiation of care has begun. The patient has been counseled on the process, plan, and necessity for staying for the completion/evaluation, and the remainder of the medical screening examination    Jeanie Sewer, PA-C 03/11/18 1811

## 2018-03-11 NOTE — ED Notes (Signed)
Results reviewed.  No changes in acuity at this time 

## 2018-04-18 NOTE — ED Provider Notes (Signed)
I was available for supervision during this PA encounter with the patient.   Madison Cantu, Jemila Camille, MD 04/18/18 [phone]

## 2018-04-27 ENCOUNTER — Encounter (INDEPENDENT_AMBULATORY_CARE_PROVIDER_SITE_OTHER): Payer: Self-pay | Admitting: Physician Assistant

## 2018-04-27 ENCOUNTER — Ambulatory Visit (INDEPENDENT_AMBULATORY_CARE_PROVIDER_SITE_OTHER): Payer: Self-pay | Admitting: Physician Assistant

## 2018-04-27 DIAGNOSIS — Z114 Encounter for screening for human immunodeficiency virus [HIV]: Secondary | ICD-10-CM

## 2018-04-27 DIAGNOSIS — Z23 Encounter for immunization: Secondary | ICD-10-CM

## 2018-04-27 DIAGNOSIS — K219 Gastro-esophageal reflux disease without esophagitis: Secondary | ICD-10-CM

## 2018-04-27 DIAGNOSIS — Z6841 Body Mass Index (BMI) 40.0 and over, adult: Secondary | ICD-10-CM

## 2018-04-27 DIAGNOSIS — N921 Excessive and frequent menstruation with irregular cycle: Secondary | ICD-10-CM

## 2018-04-27 MED ORDER — METFORMIN HCL 500 MG PO TABS: 500.0000 mg | ORAL_TABLET | Freq: Three times a day (TID) | ORAL | 1 refills | Status: DC

## 2018-04-27 MED ORDER — OMEPRAZOLE 40 MG PO CPDR: 40.0000 mg | DELAYED_RELEASE_CAPSULE | Freq: Every day | ORAL | 3 refills | Status: DC

## 2018-04-27 NOTE — Progress Notes (Signed)
Subjective:  Patient ID: Madison Cantu, female    DOB: 06-03-1990  Age: 27 y.o. MRN: [id number]  CC: headache and weight gain.  HPI Madison JACQUEZ is a 27 y.o. female with a medical history of asthma and hemorrhoids presents on to f/u on menometrorrhagia, headache, morbid obesity, and sleep disturbance. Last seen here 11/18/17. Labs, referral to sleep Cantu, and ortho-tricylen were ordered. Advised to return for f/u in six weeks and failed to do so. She is now here nearly 21 weeks after first visit.  Reports no change in menometrorrhagia with daily use of ortho-tricyclen. Reports family members have PCOS and thinks she may also have PCOS. Of note, BMI is 62. Pt was referred to sleep Cantu but failed to go to her appointment. Says she will call sleep Cantu and schedule an appointment to be evaluated. Still having daytime somnolence and morning headaches. Still has not gotten her TSH, CMP, CBC, and HIV drawn. Does not endorse any other symptoms or complaints.      Outpatient Medications Prior to Visit  Medication Sig Dispense Refill  . albuterol (PROVENTIL HFA;VENTOLIN HFA) 108 (90 Base) MCG/ACT inhaler Inhale 2 puffs into the lungs every 6 (six) hours as needed for wheezing or shortness of breath. 1 Inhaler 2  . Norgestimate-Ethinyl Estradiol Triphasic (ORTHO TRI-CYCLEN, 28,) 0.18/0.215/0.25 MG-35 MCG tablet Take 1 tablet by mouth daily. 1 Package 11   No facility-administered medications prior to visit.      ROS Review of Systems  Constitutional: Negative for chills, fever and malaise/fatigue.  Eyes: Negative for blurred vision.  Respiratory: Negative for shortness of breath.   Cardiovascular: Negative for chest pain and palpitations.  Gastrointestinal: Negative for abdominal pain and nausea.  Genitourinary: Negative for dysuria and hematuria.       Menometrorrhagia  Musculoskeletal: Negative for joint pain and myalgias.  Skin: Negative for rash.  Neurological: Negative for  tingling and headaches.  Psychiatric/Behavioral: Negative for depression. The patient is not nervous/anxious.     Objective:  BP (!) 141/83   Pulse 70   Temp 97.7 F (36.5 C) (Oral)   Resp 16   Wt (!) 365 lb 9.6 oz (165.8 kg)   SpO2 99%   BMI 62.76 kg/m   BP/Weight 04/27/2018 03/11/2018 11/18/2017  Systolic BP 141 121 117  Diastolic BP 83 82 88  Wt. (Lbs) 365.6 353 354  BMI 62.76 60.59 60.76      Physical Exam  Constitutional: She is oriented to person, place, and time.  Well developed, obese, NAD, polite  HENT:  Head: Normocephalic and atraumatic.  Eyes: No scleral icterus.  Neck: Normal range of motion. Neck supple. No thyromegaly present.  Cardiovascular: Normal rate, regular rhythm and normal heart sounds.  Pulmonary/Chest: Effort normal and breath sounds normal.  Musculoskeletal: She exhibits no edema.  Neurological: She is alert and oriented to person, place, and time.  Skin: Skin is warm and dry. No rash noted. No erythema. No pallor.  Psychiatric: She has a normal mood and affect. Her behavior is normal. Thought content normal.  Vitals reviewed.    Assessment & Plan:    1. Morbid obesity (HCC) - TSH - Comprehensive metabolic panel - Lipid panel  2. Menometrorrhagia - CBC with Differential - US Pelvic Complete With Transvaginal; Future - Failed Ortho-tricylen - Suspect PCOS, begin Metformin 500 mg TID.  3. Need for prophylactic vaccination and inoculation against influenza - Flu Vaccine QUAD 6+ mos PF IM (Fluarix Quad PF)  4. Screening for  HIV (human immunodeficiency virus) - HIV Antibody (routine testing w rflx)  5. Noncompliance - Has not completed necessary work up or kept f/u appointment.    Follow-up:  8 weeks for f/u of sleep study and menometrorrhagia.   Loletta Specteroger David Merwyn Hodapp PA

## 2018-04-27 NOTE — Patient Instructions (Addendum)
Please contact [facility] [phone] to reschedule your sleep study.   Polycystic Ovarian Syndrome Polycystic ovarian syndrome (PCOS) is a common hormonal disorder among women of reproductive age. In most women with PCOS, many small fluid-filled sacs (cysts) grow on the ovaries, and the cysts are not part of a normal menstrual cycle. PCOS can cause problems with your menstrual periods and make it difficult to get pregnant. It can also cause an increased risk of miscarriage with pregnancy. If it is not treated, PCOS can lead to serious health problems, such as diabetes and heart disease. What are the causes? The cause of PCOS is not known, but it may be the result of a combination of certain factors, such as:  Irregular menstrual cycle.  High levels of certain hormones (androgens).  Problems with the hormone that helps to control blood sugar (insulin resistance).  Certain genes.  What increases the risk? This condition is more likely to develop in women who have a family history of PCOS. What are the signs or symptoms? Symptoms of PCOS may include:  Multiple ovarian cysts.  Infrequent periods or no periods.  Periods that are too frequent or too heavy.  Unpredictable periods.  Inability to get pregnant (infertility) because of not ovulating.  Increased growth of hair on the face, chest, stomach, back, thumbs, thighs, or toes.  Acne or oily skin. Acne may develop during adulthood, and it may not respond to treatment.  Pelvic pain.  Weight gain or obesity.  Patches of thickened and dark brown or black skin on the neck, arms, breasts, or thighs (acanthosis nigricans).  Excess hair growth on the face, chest, abdomen, or upper thighs (hirsutism).  How is this diagnosed? This condition is diagnosed based on:  Your medical history.  A physical exam, including a pelvic exam. Your health care provider may look for areas of increased hair growth on your  skin.  Tests, such as: ? Ultrasound. This may be used to examine the ovaries and the lining of the uterus (endometrium) for cysts. ? Blood tests. These may be used to check levels of sugar (glucose), female hormone (testosterone), and female hormones (estrogen and progesterone) in your blood.  How is this treated? There is no cure for PCOS, but treatment can help to manage symptoms and prevent more health problems from developing. Treatment varies depending on:  Your symptoms.  Whether you want to have a baby or whether you need birth control (contraception).  Treatment may include nutrition and lifestyle changes along with:  Progesterone hormone to start a menstrual period.  Birth control pills to help you have regular menstrual periods.  Medicines to make you ovulate, if you want to get pregnant.  Medicine to reduce excessive hair growth.  Surgery, in severe cases. This may involve making small holes in one or both of your ovaries. This decreases the amount of testosterone that your body produces.  Follow these instructions at home:  Take over-the-counter and prescription medicines only as told by your health care provider.  Follow a healthy meal plan. This can help you reduce the effects of PCOS. ? Eat a healthy diet that includes lean proteins, complex carbohydrates, fresh fruits and vegetables, low-fat dairy products, and healthy fats. Make sure to eat enough fiber.  If you are overweight, lose weight as told by your health care provider. ? Losing 10% of your body weight may improve symptoms. ? Your health care provider can determine how much weight loss is best for  you and can help you lose weight safely.  Keep all follow-up visits as told by your health care provider. This is important. Contact a health care provider if:  Your symptoms do not get better with medicine.  You develop new symptoms. This information is not intended to replace advice given to you by your  health care provider. Make sure you discuss any questions you have with your health care provider. Document Released: 09/04/2004 Document Revised: 01/07/2016 Document Reviewed: 10/27/2015 Elsevier Interactive Patient Education  [employer].

## 2018-04-27 NOTE — Progress Notes (Signed)
Pt states she is having issues with weight gain. Pt states she is a very active person.  Pt states she has been having this headaches that are throbbing. Pt state she has been taking aleve and advil for the headaches but doesn't seem to get any release   Pt states the birth vontrol that was prescribed last visit wasn't;t working

## 2018-04-28 ENCOUNTER — Telehealth (INDEPENDENT_AMBULATORY_CARE_PROVIDER_SITE_OTHER): Payer: Self-pay

## 2018-04-28 LAB — CBC WITH DIFFERENTIAL/PLATELET
BASOS ABS: 0 10*3/uL (ref 0.0–0.2)
Basos: 0 %
EOS (ABSOLUTE): 0.4 10*3/uL (ref 0.0–0.4)
Eos: 8 %
Hematocrit: 37.8 % (ref 34.0–46.6)
Hemoglobin: 12.6 g/dL (ref 11.1–15.9)
Immature Grans (Abs): 0 10*3/uL (ref 0.0–0.1)
Immature Granulocytes: 0 %
LYMPHS ABS: 2.3 10*3/uL (ref 0.7–3.1)
Lymphs: 44 %
MCH: 28.3 pg (ref 26.6–33.0)
MCHC: 33.3 g/dL (ref 31.5–35.7)
MCV: 85 fL (ref 79–97)
MONOS ABS: 0.6 10*3/uL (ref 0.1–0.9)
Monocytes: 10 %
NEUTROS ABS: 2 10*3/uL (ref 1.4–7.0)
Neutrophils: 38 %
PLATELETS: 233 10*3/uL (ref 150–450)
RBC: 4.45 x10E6/uL (ref 3.77–5.28)
RDW: 13.9 % (ref 12.3–15.4)
WBC: 5.3 10*3/uL (ref 3.4–10.8)

## 2018-04-28 LAB — LIPID PANEL
CHOLESTEROL TOTAL: 162 mg/dL (ref 100–199)
Chol/HDL Ratio: 3.1 ratio (ref 0.0–4.4)
HDL: 53 mg/dL (ref >39)
LDL Calculated: 92 mg/dL (ref 0–99)
Triglycerides: 84 mg/dL (ref 0–149)
VLDL Cholesterol Cal: 17 mg/dL (ref 5–40)

## 2018-04-28 LAB — COMPREHENSIVE METABOLIC PANEL
ALBUMIN: 3.4 g/dL — AB (ref 3.5–5.5)
ALK PHOS: 75 IU/L (ref 39–117)
ALT: 14 IU/L (ref 0–32)
AST: 13 IU/L (ref 0–40)
Albumin/Globulin Ratio: 1.2 (ref 1.2–2.2)
BUN / CREAT RATIO: 13 (ref 9–23)
BUN: 8 mg/dL (ref 6–20)
CHLORIDE: 105 mmol/L (ref 96–106)
CO2: 24 mmol/L (ref 20–29)
Calcium: 8.8 mg/dL (ref 8.7–10.2)
Creatinine, Ser: 0.6 mg/dL (ref 0.57–1.00)
GFR calc Af Amer: 144 mL/min/{1.73_m2} (ref >59)
GFR calc non Af Amer: 125 mL/min/{1.73_m2} (ref >59)
GLUCOSE: 76 mg/dL (ref 65–99)
Globulin, Total: 2.9 g/dL (ref 1.5–4.5)
Potassium: 4.3 mmol/L (ref 3.5–5.2)
Sodium: 141 mmol/L (ref 134–144)
Total Protein: 6.3 g/dL (ref 6.0–8.5)

## 2018-04-28 LAB — TSH: TSH: 1.2 u[IU]/mL (ref 0.450–4.500)

## 2018-04-28 LAB — HIV ANTIBODY (ROUTINE TESTING W REFLEX): HIV Screen 4th Generation wRfx: NONREACTIVE

## 2018-04-28 NOTE — Telephone Encounter (Signed)
Patient is aware that all labs are normal. Madison Cantu, CMA  

## 2018-04-28 NOTE — Telephone Encounter (Signed)
-----   Message from Loletta Specteroger David Gomez, PA-C sent at 04/28/2018 11:35 AM EST ----- All labs normal.

## 2018-05-02 ENCOUNTER — Ambulatory Visit (HOSPITAL_COMMUNITY): Payer: Self-pay

## 2018-05-10 ENCOUNTER — Ambulatory Visit (HOSPITAL_COMMUNITY): Payer: Self-pay

## 2018-06-06 ENCOUNTER — Encounter (HOSPITAL_COMMUNITY): Payer: Self-pay

## 2018-06-06 ENCOUNTER — Ambulatory Visit (HOSPITAL_COMMUNITY)
Admission: EM | Admit: 2018-06-06 | Discharge: 2018-06-06 | Disposition: A | Discharge status: Home / Self Care | Payer: Medicaid - Out of State | Attending: Family Medicine | Admitting: Family Medicine

## 2018-06-06 ENCOUNTER — Other Ambulatory Visit: Payer: Self-pay

## 2018-06-06 DIAGNOSIS — N760 Acute vaginitis: Secondary | ICD-10-CM

## 2018-06-06 LAB — POCT URINALYSIS DIP (DEVICE)
BILIRUBIN URINE: NEGATIVE
GLUCOSE, UA: NEGATIVE mg/dL
HGB URINE DIPSTICK: NEGATIVE
Ketones, ur: NEGATIVE mg/dL
Nitrite: NEGATIVE
Protein, ur: 30 mg/dL — AB
Specific Gravity, Urine: >=1.03 (ref 1.005–1.030)
Urobilinogen, UA: 0.2 mg/dL (ref 0.0–1.0)
pH: 6 (ref 5.0–8.0)

## 2018-06-06 LAB — POCT PREGNANCY, URINE: PREG TEST UR: NEGATIVE

## 2018-06-06 MED ORDER — METRONIDAZOLE 500 MG PO TABS: 500.0000 mg | ORAL_TABLET | Freq: Two times a day (BID) | ORAL | 0 refills | Status: DC

## 2018-06-06 MED ORDER — FLUCONAZOLE 150 MG PO TABS: 150.0000 mg | ORAL_TABLET | Freq: Every day | ORAL | 0 refills | Status: DC

## 2018-06-06 NOTE — ED Triage Notes (Signed)
Pt cc vaginal discharge x 4 days. Pt  Discharge is brown now.

## 2018-06-06 NOTE — Discharge Instructions (Signed)
We are treating you for bacterial vaginosis and yeast infection Medication sent to your pharmacy Your urine was negative for infection and pregnancy Lab results pending We will call with any positive results

## 2018-06-06 NOTE — ED Provider Notes (Signed)
[facility]    CSN: [id number] Arrival date & time: 06/06/18  1446     History   Chief Complaint Chief Complaint  Patient presents with  . Vaginal Discharge    HPI Madison Cantu is a 28 y.o. female.   Is a 28 year old female that presents with 4 days of vaginal discharge, itching, irritation.  Symptoms have been constant and remain the same.  Symptoms started after changing her soap.  She has been cleaning with cool rags to help with the symptoms.  She is not currently sexually active.  There is no concern for STDs.  She denies any fever, chills, abdominal pain, back pain.  She has had some mild dysuria. Patient's last menstrual period was 05/14/2018. She is on oral contraceptives.    ROS per HPI       Past Medical History:  Diagnosis Date  . Hemorrhoids     There are no active problems to display for this patient.   History reviewed. No pertinent surgical history.  OB History   No obstetric history on file.      Home Medications    Prior to Admission medications   Medication Sig Start Date End Date Taking? Authorizing Provider  albuterol (PROVENTIL HFA;VENTOLIN HFA) 108 (90 Base) MCG/ACT inhaler Inhale 2 puffs into the lungs every 6 (six) hours as needed for wheezing or shortness of breath. 11/18/17   Loletta Specter, PA-C  fluconazole (DIFLUCAN) 150 MG tablet Take 1 tablet (150 mg total) by mouth daily. 06/06/18   Dahlia Byes A, NP  metFORMIN (GLUCOPHAGE) 500 MG tablet Take 1 tablet (500 mg total) by mouth 3 (three) times daily. 04/27/18   Loletta Specter, PA-C  metroNIDAZOLE (FLAGYL) 500 MG tablet Take 1 tablet (500 mg total) by mouth 2 (two) times daily. 06/06/18   Janace Aris, NP  Norgestimate-Ethinyl Estradiol Triphasic (ORTHO TRI-CYCLEN, 28,) 0.18/0.215/0.25 MG-35 MCG tablet Take 1 tablet by mouth daily. Patient not taking: Reported on 04/27/2018 11/18/17   Loletta Specter, PA-C  omeprazole (PRILOSEC) 40 MG capsule Take 1 capsule (40 mg  total) by mouth daily. 04/27/18   Loletta Specter, PA-C    Family History Family History  Problem Relation Age of Onset  . Hypertension Mother   . Hypertension Father   . Hypertension Brother     Social History Social History   Tobacco Use  . Smoking status: Current Every Day Smoker    Types: Cigarettes  . Smokeless tobacco: Current User  . Tobacco comment: 2 black and milds/day  Substance Use Topics  . Alcohol use: No  . Drug use: Never     Allergies   Citrus; Ciprocinonide [fluocinolone]; Clindamycin/lincomycin; Penicillins; and Aspirin   Review of Systems Review of Systems   Physical Exam Triage Vital Signs ED Triage Vitals  Enc Vitals Group     BP 06/06/18 1554 122/67     Pulse --      Resp 06/06/18 1554 15     Temp 06/06/18 1554 98.2 F (36.8 C)     Temp Source 06/06/18 1554 Oral     SpO2 06/06/18 1554 98 %     Weight 06/06/18 1552 (!) 315 lb (142.9 kg)     Height --      Head Circumference --      Peak Flow --      Pain Score 06/06/18 1552 9     Pain Loc --      Pain Edu? --  Excl. in [place]? --    No data found.  Updated Vital Signs BP 122/67 (BP Location: Right Arm)   Temp 98.2 F (36.8 C) (Oral)   Resp 15   Wt (!) 315 lb (142.9 kg)   LMP 05/14/2018   SpO2 98%   BMI 54.07 kg/m   Visual Acuity Right Eye Distance:   Left Eye Distance:   Bilateral Distance:    Right Eye Near:   Left Eye Near:    Bilateral Near:     Physical Exam Vitals signs and nursing note reviewed.  Constitutional:      Appearance: Normal appearance. She is obese.  HENT:     Head: Normocephalic and atraumatic.     Nose: Nose normal.  Eyes:     Conjunctiva/sclera: Conjunctivae normal.  Neck:     Musculoskeletal: Normal range of motion.  Pulmonary:     Effort: Pulmonary effort is normal.  Abdominal:     Palpations: Abdomen is soft.     Tenderness: There is no abdominal tenderness.  Musculoskeletal: Normal range of motion.  Skin:    General: Skin is  warm and dry.     Findings: No rash.  Neurological:     Mental Status: She is alert.  Psychiatric:        Mood and Affect: Mood normal.      UC Treatments / Results  Labs (all labs ordered are listed, but only abnormal results are displayed) Labs Reviewed  POCT URINALYSIS DIP (DEVICE) - Abnormal; Notable for the following components:      Result Value   Protein, ur 30 (*)    Leukocytes, UA SMALL (*)    All other components within normal limits  POC URINE PREG, ED  POCT PREGNANCY, URINE  CERVICOVAGINAL ANCILLARY ONLY    EKG None  Radiology No results found.  Procedures Procedures (including critical care time)  Medications Ordered in UC Medications - No data to display  Initial Impression / Assessment and Plan / UC Course  I have reviewed the triage vital signs and the nursing notes.  Pertinent labs & imaging results that were available during my care of the patient were reviewed by me and considered in my medical decision making (see chart for details).     Urine negative for pregnancy and infection Self swab sent for testing  Will go ahead and treat patient for BV and yeast based on symptoms. Lab results pending.    Final Clinical Impressions(s) / UC Diagnoses   Final diagnoses:  Acute vaginitis     Discharge Instructions     We are treating you for bacterial vaginosis and yeast infection Medication sent to your pharmacy Your urine was negative for infection and pregnancy Lab results pending We will call with any positive results    ED Prescriptions    Medication Sig Dispense Auth. Provider   metroNIDAZOLE (FLAGYL) 500 MG tablet Take 1 tablet (500 mg total) by mouth 2 (two) times daily. 14 tablet Malaijah Houchen A, NP   fluconazole (DIFLUCAN) 150 MG tablet Take 1 tablet (150 mg total) by mouth daily. 2 tablet Janace Aris, NP     Controlled Substance Prescriptions  Controlled Substance Registry consulted? no   Janace Aris, NP 06/07/18  [phone]

## 2018-06-07 LAB — CERVICOVAGINAL ANCILLARY ONLY
Bacterial vaginitis: POSITIVE — AB
Candida vaginitis: NEGATIVE
Chlamydia: NEGATIVE
NEISSERIA GONORRHEA: NEGATIVE
TRICH (WINDOWPATH): NEGATIVE

## 2018-06-07 MED FILL — FLUCONAZOLE 150 MG TABS: 150 | 2 days supply | Qty: 2 | Fill #0

## 2018-06-07 MED FILL — metroNIDAZOLE 500 MG TABS: 500 | 7 days supply | Qty: 14 | Fill #0

## 2018-06-16 ENCOUNTER — Ambulatory Visit (HOSPITAL_BASED_OUTPATIENT_CLINIC_OR_DEPARTMENT_OTHER): Payer: Medicaid - Out of State

## 2018-06-22 ENCOUNTER — Encounter (INDEPENDENT_AMBULATORY_CARE_PROVIDER_SITE_OTHER): Payer: Self-pay

## 2018-06-22 ENCOUNTER — Ambulatory Visit (INDEPENDENT_AMBULATORY_CARE_PROVIDER_SITE_OTHER): Payer: Self-pay | Admitting: Nurse Practitioner

## 2018-07-20 ENCOUNTER — Ambulatory Visit (HOSPITAL_BASED_OUTPATIENT_CLINIC_OR_DEPARTMENT_OTHER): Payer: Medicaid - Out of State | Attending: Physician Assistant | Admitting: Internal Medicine

## 2019-02-07 ENCOUNTER — Ambulatory Visit (HOSPITAL_COMMUNITY)
Admission: EM | Admit: 2019-02-07 | Discharge: 2019-02-07 | Disposition: A | Discharge status: Home / Self Care | Payer: Medicaid - Out of State | Attending: Family Medicine | Admitting: Family Medicine

## 2019-02-07 ENCOUNTER — Other Ambulatory Visit: Payer: Self-pay

## 2019-02-07 ENCOUNTER — Encounter (HOSPITAL_COMMUNITY): Payer: Self-pay | Admitting: Emergency Medicine

## 2019-02-07 DIAGNOSIS — H6123 Impacted cerumen, bilateral: Secondary | ICD-10-CM | POA: Diagnosis not present

## 2019-02-07 DIAGNOSIS — H9201 Otalgia, right ear: Secondary | ICD-10-CM | POA: Diagnosis not present

## 2019-02-07 MED ORDER — AMOXICILLIN 500 MG PO CAPS: 1000.0000 mg | ORAL_CAPSULE | Freq: Two times a day (BID) | ORAL | 0 refills | Status: AC

## 2019-02-07 MED ORDER — FLUTICASONE PROPIONATE 50 MCG/ACT NA SUSP: 1.0000 | Freq: Every day | NASAL | 2 refills | Status: DC

## 2019-02-07 NOTE — ED Triage Notes (Signed)
Pt here for right ear pain x 2 days  °

## 2019-02-07 NOTE — ED Provider Notes (Signed)
[place]    CSN: [id number] Arrival date & time: 02/07/19  1447      History   Chief Complaint Chief Complaint  Patient presents with  . Otalgia    HPI Madison Cantu is a 28 y.o. female.   Pt is a 28 year old female that presents with right ear pain. This has been constant ans worse over the past 2 days. Describes as constant dull ache. She has not treated the symptoms. Denies any associated cough, chest congestion, sore throat, PND, nasal congestion, sneezing or rhinorrhea. No fever.   ROS per HPI      Past Medical History:  Diagnosis Date  . Hemorrhoids     There are no active problems to display for this patient.   History reviewed. No pertinent surgical history.  OB History   No obstetric history on file.      Home Medications    Prior to Admission medications   Medication Sig Start Date End Date Taking? Authorizing Provider  albuterol (PROVENTIL HFA;VENTOLIN HFA) 108 (90 Base) MCG/ACT inhaler Inhale 2 puffs into the lungs every 6 (six) hours as needed for wheezing or shortness of breath. 11/18/17   Clent Demark, PA-C  amoxicillin (AMOXIL) 500 MG capsule Take 2 capsules (1,000 mg total) by mouth 2 (two) times daily for 5 days. 02/07/19 02/12/19  Loura Halt A, NP  fluticasone (FLONASE) 50 MCG/ACT nasal spray Place 1 spray into both nostrils daily. 02/07/19   Loura Halt A, NP  metFORMIN (GLUCOPHAGE) 500 MG tablet Take 1 tablet (500 mg total) by mouth 3 (three) times daily. 04/27/18   Clent Demark, PA-C  omeprazole (PRILOSEC) 40 MG capsule Take 1 capsule (40 mg total) by mouth daily. 04/27/18   Clent Demark, PA-C  Norgestimate-Ethinyl Estradiol Triphasic (ORTHO TRI-CYCLEN, 28,) 0.18/0.215/0.25 MG-35 MCG tablet Take 1 tablet by mouth daily. Patient not taking: Reported on 04/27/2018 11/18/17 02/07/19  Clent Demark, PA-C    Family History Family History  Problem Relation Age of Onset  . Hypertension Mother   . Hypertension  Father   . Hypertension Brother     Social History Social History   Tobacco Use  . Smoking status: Current Every Day Smoker    Types: Cigarettes  . Smokeless tobacco: Current User  . Tobacco comment: 2 black and milds/day  Substance Use Topics  . Alcohol use: No  . Drug use: Never     Allergies   Citrus, Ciprocinonide [fluocinolone], Clindamycin/lincomycin, Penicillins, and Aspirin   Review of Systems Review of Systems   Physical Exam Triage Vital Signs ED Triage Vitals  Enc Vitals Group     BP 02/07/19 1503 (!) 148/92     Pulse Rate 02/07/19 1503 91     Resp 02/07/19 1503 18     Temp 02/07/19 1503 (!) 97.3 F (36.3 C)     Temp Source 02/07/19 1503 Oral     SpO2 02/07/19 1503 99 %     Weight --      Height --      Head Circumference --      Peak Flow --      Pain Score 02/07/19 1504 6     Pain Loc --      Pain Edu? --      Excl. in [place]? --    No data found.  Updated Vital Signs BP (!) 148/92 (BP Location: Left Arm)   Pulse 91   Temp (!) 97.3 F (36.3  C) (Oral)   Resp 18   SpO2 99%   Visual Acuity Right Eye Distance:   Left Eye Distance:   Bilateral Distance:    Right Eye Near:   Left Eye Near:    Bilateral Near:     Physical Exam Vitals signs and nursing note reviewed.  Constitutional:      General: She is not in acute distress.    Appearance: She is obese. She is not ill-appearing, toxic-appearing or diaphoretic.  HENT:     Head: Normocephalic and atraumatic.     Right Ear: There is impacted cerumen.     Left Ear: There is impacted cerumen.     Ears:     Comments: After ear wax removal right TM slightly erythematous and bulging.     Nose: Nose normal.     Mouth/Throat:     Pharynx: Oropharynx is clear.  Eyes:     Conjunctiva/sclera: Conjunctivae normal.  Neck:     Musculoskeletal: Normal range of motion.  Cardiovascular:     Rate and Rhythm: Normal rate and regular rhythm.  Pulmonary:     Effort: Pulmonary effort is normal.      Breath sounds: Normal breath sounds.  Musculoskeletal: Normal range of motion.  Lymphadenopathy:     Cervical: No cervical adenopathy.  Skin:    General: Skin is warm and dry.  Neurological:     Mental Status: She is alert.  Psychiatric:        Mood and Affect: Mood normal.      UC Treatments / Results  Labs (all labs ordered are listed, but only abnormal results are displayed) Labs Reviewed - No data to display  EKG   Radiology No results found.  Procedures Procedures (including critical care time)  Medications Ordered in UC Medications - No data to display  Initial Impression / Assessment and Plan / UC Course  I have reviewed the triage vital signs and the nursing notes.  Pertinent labs & imaging results that were available during my care of the patient were reviewed by me and considered in my medical decision making (see chart for details).     Right ear pain- bilateral cerumen impacted Removal with lavage in clinic Left TM normal and right TM slightly erythematous and bulging.  Will go ahead and cover for ear infection  Treating with amoxicillin and Flonase for eustachian inflammation.   Final Clinical Impressions(s) / UC Diagnoses   Final diagnoses:  Otalgia of right ear     Discharge Instructions     Flonase daily Amoxicillin 2 x day for 5 days.  Follow up as needed for continued or worsening symptoms    ED Prescriptions    Medication Sig Dispense Auth. Provider   amoxicillin (AMOXIL) 500 MG capsule Take 2 capsules (1,000 mg total) by mouth 2 (two) times daily for 5 days. 20 capsule Missael Ferrari A, NP   fluticasone (FLONASE) 50 MCG/ACT nasal spray Place 1 spray into both nostrils daily. 16 g Janace ArisBast, Rudell Ortman A, NP     Controlled Substance Prescriptions [place] Controlled Substance Registry consulted? no   Janace ArisBast, Dekota Kirlin A, NP 02/07/19 1821

## 2019-02-07 NOTE — Discharge Instructions (Addendum)
Flonase daily Amoxicillin 2 x day for 5 days.  Follow up as needed for continued or worsening symptoms

## 2019-04-11 ENCOUNTER — Encounter (HOSPITAL_COMMUNITY): Payer: Self-pay

## 2019-04-11 ENCOUNTER — Other Ambulatory Visit: Payer: Self-pay

## 2019-04-11 ENCOUNTER — Ambulatory Visit (HOSPITAL_COMMUNITY)
Admission: EM | Admit: 2019-04-11 | Discharge: 2019-04-11 | Disposition: A | Discharge status: Home / Self Care | Payer: Self-pay | Attending: Family Medicine | Admitting: Family Medicine

## 2019-04-11 DIAGNOSIS — R21 Rash and other nonspecific skin eruption: Secondary | ICD-10-CM

## 2019-04-11 MED ORDER — MUPIROCIN 2 % EX OINT: 1.0000 "application " | TOPICAL_OINTMENT | Freq: Two times a day (BID) | CUTANEOUS | 0 refills | Status: DC

## 2019-04-11 MED ORDER — PERMETHRIN 5 % EX CREA: TOPICAL_CREAM | CUTANEOUS | 1 refills | Status: DC

## 2019-04-11 MED ORDER — PREDNISONE 10 MG (21) PO TBPK: ORAL_TABLET | Freq: Every day | ORAL | 0 refills | Status: DC

## 2019-04-11 NOTE — ED Provider Notes (Signed)
[place]   [id number] 04/11/19 Arrival Time: 1010  ASSESSMENT & PLAN:  1. Rash and nonspecific skin eruption     With reported intense itching. Will empirically treat for scabies. See AVS for d/c information. Mupirocin to open sores on ankles; question superficial infection. No signs of cellulitis. Discussed.   Meds ordered this encounter  Medications  . mupirocin ointment (BACTROBAN) 2 %    Sig: Apply 1 application topically 2 (two) times daily.    Dispense:  22 g    Refill:  0  . permethrin (ELIMITE) 5 % cream    Sig: Apply from neck down before bed then wash off in the morning. May repeat in one week.    Dispense:  60 g    Refill:  1  . predniSONE (STERAPRED UNI-PAK 21 TAB) 10 MG (21) TBPK tablet    Sig: Take by mouth daily. Take as directed.    Dispense:  21 tablet    Refill:  0    Reviewed expectations re: course of current medical issues. Questions answered. Outlined signs and symptoms indicating need for more acute intervention. Patient verbalized understanding. After Visit Summary given.   SUBJECTIVE:  Madison Cantu is a 28 y.o. female who presents with a skin complaint.   Location: ankles, forearms, lower back Onset: gradual Duration: noticed several days ago Associated pruritis? significant Associated pain? none  No pets at home. Progression: stable  Drainage? none Known trigger? No  New soaps/lotions/topicals/detergents? No  Environmental exposures? No  Contacts with similar? Relative who lives with her; similar symptoms. Recent travel? No  Other associated symptoms: none Therapies tried thus far: none Arthralgia or myalgia? none Recent illness? none Fever? none New medications? none No specific aggravating or alleviating factors reported.  ROS: As per HPI. All other systems negative.   OBJECTIVE: Vitals:   04/11/19 1043  BP: 117/87  Pulse: 77  Resp: 18  Temp: 98.8 F (37.1 C)  TempSrc: Oral  SpO2: 100%    General  appearance: alert; no distress HEENT: [place]; AT Neck: supple with FROM Lungs: clear to auscultation bilaterally Heart: regular rate and rhythm Extremities: no edema; moves all extremities normally Skin: warm and dry; ankles, forearms, and lower back with areas of scattered pustules, some opened with excoriations; no active drainage or bleeding Psychological: alert and cooperative; normal mood and affect  Allergies  Allergen Reactions  . Citrus Anaphylaxis  . Ciprocinonide [Fluocinolone]   . Clindamycin/Lincomycin Hives  . Penicillins Itching  . Shellfish Allergy   . Aspirin Rash    Past Medical History:  Diagnosis Date  . Hemorrhoids    Social History   Socioeconomic History  . Marital status: Single    Spouse name: Not on file  . Number of children: Not on file  . Years of education: Not on file  . Highest education level: Not on file  Occupational History  . Not on file  Social Needs  . Financial resource strain: Not on file  . Food insecurity    Worry: Not on file    Inability: Not on file  . Transportation needs    Medical: Not on file    Non-medical: Not on file  Tobacco Use  . Smoking status: Current Every Day Smoker    Types: Cigarettes  . Smokeless tobacco: Current User  . Tobacco comment: 2 black and milds/day  Substance and Sexual Activity  . Alcohol use: No  . Drug use: Never  . Sexual activity: Yes  Partners: Female    Birth control/protection: Condom  Lifestyle  . Physical activity    Days per week: Not on file    Minutes per session: Not on file  . Stress: Not on file  Relationships  . Social Musician on phone: Not on file    Gets together: Not on file    Attends religious service: Not on file    Active member of club or organization: Not on file    Attends meetings of clubs or organizations: Not on file    Relationship status: Not on file  . Intimate partner violence    Fear of current or ex partner: Not on file     Emotionally abused: Not on file    Physically abused: Not on file    Forced sexual activity: Not on file  Other Topics Concern  . Not on file  Social History Narrative  . Not on file   Family History  Problem Relation Age of Onset  . Hypertension Mother   . Hypertension Father   . Hypertension Brother    History reviewed. No pertinent surgical history.   Mardella Layman, MD 04/11/19 1131

## 2019-04-11 NOTE — ED Triage Notes (Signed)
Pt presents with rash (pustules) on both ankles and both arms that is very itchy for past few days.

## 2019-04-26 ENCOUNTER — Ambulatory Visit (HOSPITAL_COMMUNITY)
Admission: EM | Admit: 2019-04-26 | Discharge: 2019-04-26 | Disposition: A | Discharge status: Home / Self Care | Payer: Medicaid - Out of State | Attending: Family Medicine | Admitting: Family Medicine

## 2019-04-26 ENCOUNTER — Encounter (HOSPITAL_COMMUNITY): Payer: Self-pay | Admitting: Emergency Medicine

## 2019-04-26 ENCOUNTER — Other Ambulatory Visit: Payer: Self-pay

## 2019-04-26 DIAGNOSIS — A691 Other Vincent's infections: Secondary | ICD-10-CM | POA: Diagnosis not present

## 2019-04-26 DIAGNOSIS — R030 Elevated blood-pressure reading, without diagnosis of hypertension: Secondary | ICD-10-CM

## 2019-04-26 MED ORDER — HYDROCODONE-ACETAMINOPHEN 5-325 MG PO TABS: 1.0000 | ORAL_TABLET | Freq: Four times a day (QID) | ORAL | 0 refills | Status: DC | PRN

## 2019-04-26 MED ORDER — AMOXICILLIN-POT CLAVULANATE 875-125 MG PO TABS: 1.0000 | ORAL_TABLET | Freq: Two times a day (BID) | ORAL | 0 refills | Status: DC

## 2019-04-26 MED ORDER — IBUPROFEN 800 MG PO TABS
ORAL_TABLET | ORAL | Status: AC
  Filled 2019-04-26: qty 1

## 2019-04-26 MED ORDER — IBUPROFEN 800 MG PO TABS
800.0000 mg | ORAL_TABLET | Freq: Once | ORAL | Status: AC
  Administered 2019-04-26: 800 mg via ORAL

## 2019-04-26 NOTE — ED Triage Notes (Signed)
Patient reports dental pain for 2 days.  Pain is bottom, front teeth and left side of face/jaw

## 2019-04-26 NOTE — Discharge Instructions (Addendum)
Begin the antibiotic prescribed tonight. Be aware, pain medications may cause drowsiness. Please do not drive, operate heavy machinery or make important decisions while on this medication, it can cloud your judgement.  Call to schedule an appointment with a dentist as soon as possible.  Your blood pressure was noted to be elevated during your visit today. You may return here within the next few days to recheck if unable to see your primary care doctor. If your blood pressure remains persistently elevated, you may need to begin taking a medication.  BP (!) 145/108 (BP Location: Right Arm) Comment: this is second attempt, better than first reading Comment (BP Location): regular cuff, forearm   Pulse 85    Temp 99 F (37.2 C) (Oral)    Resp (!) 24    LMP 03/29/2019    SpO2 98%

## 2019-04-29 NOTE — ED Provider Notes (Signed)
[facility]   [id number] 04/26/19 Arrival Time: 8546  ASSESSMENT & PLAN:  1. Acute necrotizing gingivitis   2. Elevated blood pressure reading without diagnosis of hypertension      No sign of abscess requiring I&D at this time. Discussed.  Meds ordered this encounter  Medications  . ibuprofen (ADVIL) tablet 800 mg  . HYDROcodone-acetaminophen (NORCO/VICODIN) 5-325 MG tablet    Sig: Take 1 tablet by mouth every 6 (six) hours as needed for moderate pain or severe pain.    Dispense:  8 tablet    Refill:  0  . amoxicillin-clavulanate (AUGMENTIN) 875-125 MG tablet    Sig: Take 1 tablet by mouth every 12 (twelve) hours.    Dispense:  20 tablet    Refill:  0   [place] Controlled Substances Registry consulted for this patient. I feel the risk/benefit ratio today is favorable for proceeding with this prescription for a controlled substance. Medication sedation precautions given.  To watch closely and seek prompt f/u with any worsening. She voices understanding. Work note provided.    Discharge Instructions     Begin the antibiotic prescribed tonight. Be aware, pain medications may cause drowsiness. Please do not drive, operate heavy machinery or make important decisions while on this medication, it can cloud your judgement.  Call to schedule an appointment with a dentist as soon as possible.  Your blood pressure was noted to be elevated during your visit today. You may return here within the next few days to recheck if unable to see your primary care doctor. If your blood pressure remains persistently elevated, you may need to begin taking a medication.  BP (!) 145/108 (BP Location: Right Arm) Comment: this is second attempt, better than first reading Comment (BP Location): regular cuff, forearm  Pulse 85   Temp 99 F (37.2 C) (Oral)   Resp (!) 24   LMP 03/29/2019   SpO2 98%       Reviewed expectations re: course of current medical issues. Questions answered.  Outlined signs and symptoms indicating need for more acute intervention. Patient verbalized understanding. After Visit Summary given.   SUBJECTIVE:  Madison Cantu is a 28 y.o. female who reports gradual onset of lower frontal dental pain described as "just sore and painful". Present for 2-3 days. Fever: absent. Tolerating PO intake but reports pain with chewing. Normal swallowing. She does not see a dentist regularly. No neck swelling or pain. OTC analgesics without relief. No gum bleeding.  ROS: As per HPI. All other systems negative.   OBJECTIVE: Vitals:   04/26/19 1741  BP: (!) 145/108  Pulse: 85  Resp: (!) 24  Temp: 99 F (37.2 C)  TempSrc: Oral  SpO2: 98%    Recheck RR: 18  General appearance: alert; no distress HENT: normocephalic; atraumatic; dentition: poor; anterior lower gums with significant darkening compared to surrounding; foul odor; no drainage or bleeding; very TTP normal jaw movement without difficulty Neck: supple without LAD; FROM; trachea midline CV: RRR Lungs: normal respirations; unlabored Skin: warm and dry Psychological: alert and cooperative; normal mood and affect  Allergies  Allergen Reactions  . Citrus Anaphylaxis  . Ciprocinonide [Fluocinolone]   . Clindamycin/Lincomycin Hives  . Penicillins Itching  . Shellfish Allergy   . Aspirin Rash    Past Medical History:  Diagnosis Date  . Hemorrhoids    Social History   Socioeconomic History  . Marital status: Single    Spouse name: Not on file  . Number of children: Not  on file  . Years of education: Not on file  . Highest education level: Not on file  Occupational History  . Not on file  Social Needs  . Financial resource strain: Not on file  . Food insecurity    Worry: Not on file    Inability: Not on file  . Transportation needs    Medical: Not on file    Non-medical: Not on file  Tobacco Use  . Smoking status: Current Every Day Smoker    Types: Cigarettes  . Smokeless  tobacco: Current User  . Tobacco comment: 2 black and milds/day  Substance and Sexual Activity  . Alcohol use: No  . Drug use: Never  . Sexual activity: Yes    Partners: Female    Birth control/protection: None  Lifestyle  . Physical activity    Days per week: Not on file    Minutes per session: Not on file  . Stress: Not on file  Relationships  . Social Musician on phone: Not on file    Gets together: Not on file    Attends religious service: Not on file    Active member of club or organization: Not on file    Attends meetings of clubs or organizations: Not on file    Relationship status: Not on file  . Intimate partner violence    Fear of current or ex partner: Not on file    Emotionally abused: Not on file    Physically abused: Not on file    Forced sexual activity: Not on file  Other Topics Concern  . Not on file  Social History Narrative  . Not on file   Family History  Problem Relation Age of Onset  . Hypertension Mother   . Hypertension Father   . Hypertension Brother    History reviewed. No pertinent surgical history.   Mardella Layman, MD 04/29/19 [phone]

## 2019-09-06 ENCOUNTER — Ambulatory Visit (INDEPENDENT_AMBULATORY_CARE_PROVIDER_SITE_OTHER): Payer: Medicaid - Out of State | Admitting: Primary Care

## 2019-11-15 ENCOUNTER — Telehealth (INDEPENDENT_AMBULATORY_CARE_PROVIDER_SITE_OTHER): Payer: Medicaid - Out of State | Admitting: Primary Care

## 2020-01-10 ENCOUNTER — Encounter (HOSPITAL_COMMUNITY): Payer: Self-pay | Admitting: Emergency Medicine

## 2020-01-10 ENCOUNTER — Other Ambulatory Visit: Payer: Self-pay

## 2020-01-10 ENCOUNTER — Ambulatory Visit (HOSPITAL_COMMUNITY)
Admission: EM | Admit: 2020-01-10 | Discharge: 2020-01-10 | Disposition: A | Discharge status: Home / Self Care | Payer: HRSA Program | Attending: Family Medicine | Admitting: Family Medicine

## 2020-01-10 DIAGNOSIS — Z87891 Personal history of nicotine dependence: Secondary | ICD-10-CM | POA: Insufficient documentation

## 2020-01-10 DIAGNOSIS — Z886 Allergy status to analgesic agent status: Secondary | ICD-10-CM | POA: Insufficient documentation

## 2020-01-10 DIAGNOSIS — Z880 Allergy status to penicillin: Secondary | ICD-10-CM | POA: Insufficient documentation

## 2020-01-10 DIAGNOSIS — Z8719 Personal history of other diseases of the digestive system: Secondary | ICD-10-CM | POA: Diagnosis not present

## 2020-01-10 DIAGNOSIS — U071 COVID-19: Secondary | ICD-10-CM | POA: Diagnosis not present

## 2020-01-10 DIAGNOSIS — J069 Acute upper respiratory infection, unspecified: Secondary | ICD-10-CM

## 2020-01-10 DIAGNOSIS — Z881 Allergy status to other antibiotic agents status: Secondary | ICD-10-CM | POA: Diagnosis not present

## 2020-01-10 NOTE — ED Triage Notes (Signed)
Patient started feeling sick 2 days ago.  Patient has a sore throat, nausea, diarrhea and headaches, and cough.

## 2020-01-10 NOTE — Discharge Instructions (Signed)
You have been tested for COVID-19 today. °If your test returns positive, you will receive a phone call from [place] regarding your results. °Negative test results are not called. °Both positive and negative results area always visible on MyChart. °If you do not have a MyChart account, sign up instructions are provided in your discharge papers. °Please do not hesitate to contact us should you have questions or concerns. ° °

## 2020-01-10 NOTE — ED Provider Notes (Signed)
[facility]   [id number] 01/10/20 Arrival Time: 1239  ASSESSMENT & PLAN:  1. Viral URI with cough      COVID-19 testing sent. See letter/work note on file for self-isolation guidelines. OTC symptom care as needed.   Follow-up Information    Loletta Specter, PA-C.   Specialty: Physician Assistant Why: As needed. Contact information: [address] [place] [address] [phone]               Reviewed expectations re: course of current medical issues. Questions answered. Outlined signs and symptoms indicating need for more acute intervention. Understanding verbalized. After Visit Summary given.   SUBJECTIVE: History from: patient. Madison Cantu is a 29 y.o. female who requests COVID-19 testing. Known COVID-19 contact: none but around sick people. Recent travel: none. Reports: nasal congestion, cough, sore throat. 2-3 d. Denies: fever and difficulty breathing. Normal PO intake without n/v/d.    OBJECTIVE:  Vitals:   01/10/20 1457  BP: (!) 144/90  Pulse: 78  Resp: 20  Temp: 98.8 F (37.1 C)  TempSrc: Oral  SpO2: 99%    General appearance: alert; no distress Eyes: PERRLA; EOMI; conjunctiva normal HENT: [place]; AT; nasal congestion Neck: supple  Lungs: speaks full sentences without difficulty; unlabored Extremities: no edema Skin: warm and dry Neurologic: normal gait Psychological: alert and cooperative; normal mood and affect  Labs:  Labs Reviewed  SARS CORONAVIRUS 2 (TAT 6-24 HRS)      Allergies  Allergen Reactions   Citrus Anaphylaxis   Ciprocinonide [Fluocinolone]    Clindamycin/Lincomycin Hives   Penicillins Itching   Shellfish Allergy    Aspirin Rash    Past Medical History:  Diagnosis Date   Hemorrhoids    Social History   Socioeconomic History   Marital status: Significant Other    Spouse name: Not on file   Number of children: Not on file   Years of education: Not on file   Highest  education level: Not on file  Occupational History   Not on file  Tobacco Use   Smoking status: Former Smoker    Types: Cigarettes   Smokeless tobacco: Current User   Tobacco comment: 2 black and milds/day  Vaping Use   Vaping Use: Never used  Substance and Sexual Activity   Alcohol use: No   Drug use: Never   Sexual activity: Yes    Partners: Female    Birth control/protection: None  Other Topics Concern   Not on file  Social History Narrative   Not on file   Social Determinants of Health   Financial Resource Strain:    Difficulty of Paying Living Expenses:   Food Insecurity:    Worried About Programme researcher, broadcasting/film/video in the Last Year:    Barista in the Last Year:   Transportation Needs:    Freight forwarder (Medical):    Lack of Transportation (Non-Medical):   Physical Activity:    Days of Exercise per Week:    Minutes of Exercise per Session:   Stress:    Feeling of Stress :   Social Connections:    Frequency of Communication with Friends and Family:    Frequency of Social Gatherings with Friends and Family:    Attends Religious Services:    Active Member of Clubs or Organizations:    Attends Banker Meetings:    Marital Status:   Intimate Partner Violence:    Fear of Current or Ex-Partner:  Emotionally Abused:    Physically Abused:    Sexually Abused:    Family History  Problem Relation Age of Onset   Hypertension Mother    Hypertension Father    Hypertension Brother    History reviewed. No pertinent surgical history.   Mardella Layman, MD 01/10/20 1525

## 2020-01-11 ENCOUNTER — Telehealth: Payer: Self-pay | Admitting: Infectious Diseases

## 2020-01-11 LAB — SARS CORONAVIRUS 2 (TAT 6-24 HRS): SARS Coronavirus 2: POSITIVE — AB

## 2020-01-11 NOTE — Telephone Encounter (Signed)
Called to Discuss with patient about Covid symptoms and the use of the monoclonal antibody infusion for those with mild to moderate Covid symptoms and at a high risk of hospitalization.     Pt appears to qualify for this infusion due to co-morbid conditions and/or a member of an at-risk group in accordance with the FDA Emergency Use Authorization. Will need to clarify symptom duration.    Unable to reach pt - I left her a voicemail and sent a MyChart message.    Rexene Alberts, MSN, NP-C [facility]  [email or web address] Pager: [phone] Office: [phone] [facility] Main Line: [phone]

## 2020-02-20 ENCOUNTER — Encounter (INDEPENDENT_AMBULATORY_CARE_PROVIDER_SITE_OTHER): Payer: Self-pay | Admitting: Primary Care

## 2020-02-20 ENCOUNTER — Ambulatory Visit (INDEPENDENT_AMBULATORY_CARE_PROVIDER_SITE_OTHER): Payer: Medicaid - Out of State | Admitting: Primary Care

## 2020-02-20 ENCOUNTER — Other Ambulatory Visit: Payer: Self-pay

## 2020-02-20 VITALS — BP 131/83 | HR 67 | Temp 95.0°F | Ht 64.0 in | Wt 327.2 lb

## 2020-02-20 DIAGNOSIS — G4733 Obstructive sleep apnea (adult) (pediatric): Secondary | ICD-10-CM

## 2020-02-20 DIAGNOSIS — Z131 Encounter for screening for diabetes mellitus: Secondary | ICD-10-CM

## 2020-02-20 DIAGNOSIS — N921 Excessive and frequent menstruation with irregular cycle: Secondary | ICD-10-CM | POA: Diagnosis not present

## 2020-02-20 DIAGNOSIS — R1031 Right lower quadrant pain: Secondary | ICD-10-CM

## 2020-02-20 DIAGNOSIS — Z1159 Encounter for screening for other viral diseases: Secondary | ICD-10-CM

## 2020-02-20 DIAGNOSIS — Z7689 Persons encountering health services in other specified circumstances: Secondary | ICD-10-CM

## 2020-02-20 LAB — POCT GLYCOSYLATED HEMOGLOBIN (HGB A1C): Hemoglobin A1C: 5.3 % (ref 4.0–5.6)

## 2020-02-20 NOTE — Patient Instructions (Addendum)
Schedule fasting labs   Polycystic Ovarian Syndrome  Polycystic ovarian syndrome (PCOS) is a common hormonal disorder among women of reproductive age. In most women with PCOS, many small fluid-filled sacs (cysts) grow on the ovaries, and the cysts are not part of a normal menstrual cycle. PCOS can cause problems with your menstrual periods and make it difficult to get pregnant. It can also cause an increased risk of miscarriage with pregnancy. If it is not treated, PCOS can lead to serious health problems, such as diabetes and heart disease. What are the causes? The cause of PCOS is not known, but it may be the result of a combination of certain factors, such as:  Irregular menstrual cycle.  High levels of certain hormones (androgens).  Problems with the hormone that helps to control blood sugar (insulin resistance).  Certain genes. What increases the risk? This condition is more likely to develop in women who have a family history of PCOS. What are the signs or symptoms? Symptoms of PCOS may include:  Multiple ovarian cysts.  Infrequent periods or no periods.  Periods that are too frequent or too heavy.  Unpredictable periods.  Inability to get pregnant (infertility) because of not ovulating.  Increased growth of hair on the face, chest, stomach, back, thumbs, thighs, or toes.  Acne or oily skin. Acne may develop during adulthood, and it may not respond to treatment.  Pelvic pain.  Weight gain or obesity.  Patches of thickened and dark brown or black skin on the neck, arms, breasts, or thighs (acanthosis nigricans).  Excess hair growth on the face, chest, abdomen, or upper thighs (hirsutism). How is this diagnosed? This condition is diagnosed based on:  Your medical history.  A physical exam, including a pelvic exam. Your health care provider may look for areas of increased hair growth on your skin.  Tests, such as: ? Ultrasound. This may be used to examine the  ovaries and the lining of the uterus (endometrium) for cysts. ? Blood tests. These may be used to check levels of sugar (glucose), female hormone (testosterone), and female hormones (estrogen and progesterone) in your blood. How is this treated? There is no cure for PCOS, but treatment can help to manage symptoms and prevent more health problems from developing. Treatment varies depending on:  Your symptoms.  Whether you want to have a baby or whether you need birth control (contraception). Treatment may include nutrition and lifestyle changes along with:  Progesterone hormone to start a menstrual period.  Birth control pills to help you have regular menstrual periods.  Medicines to make you ovulate, if you want to get pregnant.  Medicine to reduce excessive hair growth.  Surgery, in severe cases. This may involve making small holes in one or both of your ovaries. This decreases the amount of testosterone that your body produces. Follow these instructions at home:  Take over-the-counter and prescription medicines only as told by your health care provider.  Follow a healthy meal plan. This can help you reduce the effects of PCOS. ? Eat a healthy diet that includes lean proteins, complex carbohydrates, fresh fruits and vegetables, low-fat dairy products, and healthy fats. Make sure to eat enough fiber.  If you are overweight, lose weight as told by your health care provider. ? Losing 10% of your body weight may improve symptoms. ? Your health care provider can determine how much weight loss is best for you and can help you lose weight safely.  Keep all follow-up visits as told  by your health care provider. This is important. Contact a health care provider if:  Your symptoms do not get better with medicine.  You develop new symptoms. This information is not intended to replace advice given to you by your health care provider. Make sure you discuss any questions you have with your  health care provider. Document Revised: 04/23/2017 Document Reviewed: 10/27/2015 Elsevier Patient Education  2020 [employer].

## 2020-02-20 NOTE — Progress Notes (Signed)
Established Patient Office Visit  Subjective:  Patient ID: EMRI SAMPLE, female    DOB: 07-21-1990  Age: 29 y.o. MRN: [id number]  CC:  Chief Complaint  Patient presents with   Establish Care    with new provider    Abdominal Pain    right lower quad- sensitive to touch     HPI Madison Cantu presents for re -establish care previously seen at [facility] but less than 3 years. She voices concern about diabetes - she has a family history also has a sharpe pain especially to touch right lower quadrant worse on her menstrual cycle.   Past Medical History:  Diagnosis Date   Hemorrhoids     History reviewed. No pertinent surgical history.  Family History  Problem Relation Age of Onset   Hypertension Mother    Hypertension Father    Hypertension Brother     Social History   Socioeconomic History   Marital status: Significant Other    Spouse name: Not on file   Number of children: Not on file   Years of education: Not on file   Highest education level: Not on file  Occupational History   Not on file  Tobacco Use   Smoking status: Former Smoker    Types: Cigarettes   Smokeless tobacco: Current User   Tobacco comment: 2 1/2 months   Vaping Use   Vaping Use: Never used  Substance and Sexual Activity   Alcohol use: No   Drug use: Never   Sexual activity: Yes    Partners: Female    Birth control/protection: None  Other Topics Concern   Not on file  Social History Narrative   Not on file   Social Determinants of Health   Financial Resource Strain:    Difficulty of Paying Living Expenses: Not on file  Food Insecurity:    Worried About Programme researcher, broadcasting/film/video in the Last Year: Not on file   [employer] of Food in the Last Year: Not on file  Transportation Needs:    Lack of Transportation (Medical): Not on file   Lack of Transportation (Non-Medical): Not on file  Physical Activity:    Days of Exercise per Week: Not on file   Minutes of  Exercise per Session: Not on file  Stress:    Feeling of Stress : Not on file  Social Connections:    Frequency of Communication with Friends and Family: Not on file   Frequency of Social Gatherings with Friends and Family: Not on file   Attends Religious Services: Not on file   Active Member of Clubs or Organizations: Not on file   Attends Banker Meetings: Not on file   Marital Status: Not on file  Intimate Partner Violence:    Fear of Current or Ex-Partner: Not on file   Emotionally Abused: Not on file   Physically Abused: Not on file   Sexually Abused: Not on file    Outpatient Medications Prior to Visit  Medication Sig Dispense Refill   albuterol (PROVENTIL HFA;VENTOLIN HFA) 108 (90 Base) MCG/ACT inhaler Inhale 2 puffs into the lungs every 6 (six) hours as needed for wheezing or shortness of breath. 1 Inhaler 2   fluticasone (FLONASE) 50 MCG/ACT nasal spray Place 1 spray into both nostrils daily. 16 g 2   No facility-administered medications prior to visit.    Allergies  Allergen Reactions   Citrus Anaphylaxis   Ciprocinonide [Fluocinolone]    Clindamycin/Lincomycin Hives  Penicillins Itching   Shellfish Allergy    Aspirin Rash    ROS Review of Systems  Gastrointestinal: Positive for abdominal distention and abdominal pain.       RLQ increase pain with lifting and bending.  Neurological: Positive for numbness.       Tingling in feet   Psychiatric/Behavioral: Positive for sleep disturbance.  All other systems reviewed and are negative.     Objective:    Physical Exam Vitals reviewed.  Constitutional:      Appearance: She is obese.  HENT:     Right Ear: Tympanic membrane normal.     Left Ear: Tympanic membrane normal.  Eyes:     Extraocular Movements: Extraocular movements intact.     Pupils: Pupils are equal, round, and reactive to light.  Cardiovascular:     Rate and Rhythm: Normal rate and regular rhythm.     Pulses:  Normal pulses.     Heart sounds: Normal heart sounds.  Pulmonary:     Effort: Pulmonary effort is normal.     Breath sounds: Normal breath sounds.  Abdominal:     General: Bowel sounds are normal. There is distension.     Palpations: Abdomen is soft.     Tenderness: There is abdominal tenderness in the right lower quadrant and suprapubic area.  Musculoskeletal:        General: Normal range of motion.     Cervical back: Normal range of motion and neck supple.  Skin:    General: Skin is warm and dry.  Neurological:     Mental Status: She is alert and oriented to person, place, and time.  Psychiatric:        Mood and Affect: Mood normal.        Behavior: Behavior normal.        Thought Content: Thought content normal.        Judgment: Judgment normal.     BP 131/83 (BP Location: Right Arm, Patient Position: Sitting) Comment (Cuff Size): thigh   Pulse 67    Temp (!) 95 F (35 C) (Temporal)    Ht 5\' 4"  (1.626 m)    Wt (!) 327 lb 3.2 oz (148.4 kg)    LMP 02/05/2020 (Exact Date)    SpO2 97%    BMI 56.16 kg/m  Wt Readings from Last 3 Encounters:  02/20/20 (!) 327 lb 3.2 oz (148.4 kg)  06/06/18 (!) 315 lb (142.9 kg)  04/27/18 (!) 365 lb 9.6 oz (165.8 kg)     Health Maintenance Due  Topic Date Due   Hepatitis C Screening  Never done   COVID-19 Vaccine (1) Never done   PAP-Cervical Cytology Screening  Never done   PAP SMEAR-Modifier  Never done   INFLUENZA VACCINE  12/24/2019    There are no preventive care reminders to display for this patient.  Lab Results  Component Value Date   TSH 1.200 04/27/2018   Lab Results  Component Value Date   WBC 5.3 04/27/2018   HGB 12.6 04/27/2018   HCT 37.8 04/27/2018   MCV 85 04/27/2018   PLT 233 04/27/2018   Lab Results  Component Value Date   NA 141 04/27/2018   K 4.3 04/27/2018   CO2 24 04/27/2018   GLUCOSE 76 04/27/2018   BUN 8 04/27/2018   CREATININE 0.60 04/27/2018   BILITOT <0.2 04/27/2018   ALKPHOS 75 04/27/2018    AST 13 04/27/2018   ALT 14 04/27/2018   PROT 6.3 04/27/2018  ALBUMIN 3.4 (L) 04/27/2018   CALCIUM 8.8 04/27/2018   ANIONGAP 7 03/11/2018   Lab Results  Component Value Date   CHOL 162 04/27/2018   Lab Results  Component Value Date   HDL 53 04/27/2018   Lab Results  Component Value Date   LDLCALC 92 04/27/2018   Lab Results  Component Value Date   TRIG 84 04/27/2018   Lab Results  Component Value Date   CHOLHDL 3.1 04/27/2018   Lab Results  Component Value Date   HGBA1C 5.3 02/20/2020      Assessment & Plan:  Burdette was seen today for establish care and abdominal pain.  Diagnoses and all orders for this visit:  Screening for diabetes mellitus -     HgB A1c 5.3 Explained criteria for prediabetes per 5.7-6.4 ADA . She is not a Diabetic   Morbid obesity (HCC) Morbid Obesity is > 40 indicating an excess in caloric intake or underlining conditions. This may lead to other co-morbidities.( Hypoventilation syndrome, Diabetes and HTN Lifestyle modifications of diet and exercise may reduce obesity.   Menometrorrhagia Menorrhalgia heavy 4 days vaginal bleeding with irregular menstrual cycles.  Referral to [facility] when insurance has changed.  OSA (obstructive sleep apnea) -     Nocturnal polysomnography (NPSG); Future Patient presents with possible obstructive sleep apnea. Patent has a 4 years history of symptoms of daytime fatigue and morning headache. Patient generally gets 4 or 5 hours of sleep per night, and states they generally have difficulty falling asleep. Snoring of moderate severity is present. Apneic episodes is present. Nasal obstruction is not present.  Patient has not had tonsillectomy.   RLQ abdominal pain  RUQ with GERD  epigastric pain, increased pain with acidity , No fever.abdominal guarding or  Murphy's sign  Encounter to establish care with new provider  Gwinda Passe, NP-C will be your  (PCP) she is mastered prepared . She is skilled to diagnosed and  treat illness. Also able to answer health concern as well as continuing care of varied medical conditions, not limited by cause, organ system, or diagnosis.   No orders of the defined types were placed in this encounter.   Follow-up: Return in about 3 months (around 05/21/2020) for Bp follow up .    Grayce Sessions, NP

## 2020-02-21 LAB — CMP14+EGFR
ALT: 9 IU/L (ref 0–32)
AST: 12 IU/L (ref 0–40)
Albumin/Globulin Ratio: 1.1 — ABNORMAL LOW (ref 1.2–2.2)
Albumin: 3.9 g/dL (ref 3.9–5.0)
Alkaline Phosphatase: 89 IU/L (ref 44–121)
BUN/Creatinine Ratio: 13 (ref 9–23)
BUN: 8 mg/dL (ref 6–20)
Bilirubin Total: 0.3 mg/dL (ref 0.0–1.2)
CO2: 21 mmol/L (ref 20–29)
Calcium: 9.6 mg/dL (ref 8.7–10.2)
Chloride: 104 mmol/L (ref 96–106)
Creatinine, Ser: 0.64 mg/dL (ref 0.57–1.00)
GFR calc Af Amer: 140 mL/min/{1.73_m2} (ref >59)
GFR calc non Af Amer: 122 mL/min/{1.73_m2} (ref >59)
Globulin, Total: 3.6 g/dL (ref 1.5–4.5)
Glucose: 89 mg/dL (ref 65–99)
Potassium: 4.3 mmol/L (ref 3.5–5.2)
Sodium: 137 mmol/L (ref 134–144)
Total Protein: 7.5 g/dL (ref 6.0–8.5)

## 2020-02-21 LAB — CBC WITH DIFFERENTIAL/PLATELET
Basophils Absolute: 0 10*3/uL (ref 0.0–0.2)
Basos: 0 %
EOS (ABSOLUTE): 0.4 10*3/uL (ref 0.0–0.4)
Eos: 7 %
Hematocrit: 40.5 % (ref 34.0–46.6)
Hemoglobin: 13.3 g/dL (ref 11.1–15.9)
Immature Grans (Abs): 0 10*3/uL (ref 0.0–0.1)
Immature Granulocytes: 0 %
Lymphocytes Absolute: 1.6 10*3/uL (ref 0.7–3.1)
Lymphs: 29 %
MCH: 27.9 pg (ref 26.6–33.0)
MCHC: 32.8 g/dL (ref 31.5–35.7)
MCV: 85 fL (ref 79–97)
Monocytes Absolute: 0.6 10*3/uL (ref 0.1–0.9)
Monocytes: 10 %
Neutrophils Absolute: 3 10*3/uL (ref 1.4–7.0)
Neutrophils: 54 %
Platelets: 241 10*3/uL (ref 150–450)
RBC: 4.77 x10E6/uL (ref 3.77–5.28)
RDW: 14.4 % (ref 11.7–15.4)
WBC: 5.6 10*3/uL (ref 3.4–10.8)

## 2020-02-21 LAB — LIPID PANEL
Chol/HDL Ratio: 4.3 ratio (ref 0.0–4.4)
Cholesterol, Total: 162 mg/dL (ref 100–199)
HDL: 38 mg/dL — ABNORMAL LOW (ref >39)
LDL Chol Calc (NIH): 103 mg/dL — ABNORMAL HIGH (ref 0–99)
Triglycerides: 113 mg/dL (ref 0–149)
VLDL Cholesterol Cal: 21 mg/dL (ref 5–40)

## 2020-02-21 LAB — TSH+FREE T4
Free T4: 1.24 ng/dL (ref 0.82–1.77)
TSH: 0.03 u[IU]/mL — ABNORMAL LOW (ref 0.450–4.500)

## 2020-02-21 LAB — HEPATITIS C ANTIBODY: Hep C Virus Ab: <0.1 s/co ratio (ref 0.0–0.9)

## 2020-04-12 ENCOUNTER — Other Ambulatory Visit: Payer: Self-pay

## 2020-04-12 ENCOUNTER — Encounter (HOSPITAL_COMMUNITY): Payer: Self-pay

## 2020-04-12 ENCOUNTER — Ambulatory Visit (HOSPITAL_COMMUNITY)
Admission: EM | Admit: 2020-04-12 | Discharge: 2020-04-12 | Disposition: A | Discharge status: Home / Self Care | Payer: Medicaid - Out of State | Attending: Physician Assistant | Admitting: Physician Assistant

## 2020-04-12 DIAGNOSIS — L239 Allergic contact dermatitis, unspecified cause: Secondary | ICD-10-CM

## 2020-04-12 DIAGNOSIS — R300 Dysuria: Secondary | ICD-10-CM

## 2020-04-12 LAB — POCT URINALYSIS DIPSTICK, ED / UC
Bilirubin Urine: NEGATIVE
Glucose, UA: NEGATIVE mg/dL
Ketones, ur: NEGATIVE mg/dL
Nitrite: NEGATIVE
Protein, ur: NEGATIVE mg/dL
Specific Gravity, Urine: 1.025 (ref 1.005–1.030)
Urobilinogen, UA: 0.2 mg/dL (ref 0.0–1.0)
pH: 7 (ref 5.0–8.0)

## 2020-04-12 NOTE — ED Triage Notes (Signed)
Pt in with c/o burning during urination and dysuria. States that she feel like she has to pee but nothing comes out. Also c/o lower abdominal pain.  Pt took AZO to help with sxs  Denies discharge or vaginal itching

## 2020-04-12 NOTE — Discharge Instructions (Addendum)
Your urine culture is pending.  Apply a small amount of topical hydrocortisone to area of irritation twice a day for 2 days to see if this resolves symptoms.  Avoid latex

## 2020-04-13 LAB — URINE CULTURE

## 2020-04-13 NOTE — ED Provider Notes (Signed)
[facility]    CSN: [id number] Arrival date & time: 04/12/20  1033      History   Chief Complaint Chief Complaint  Patient presents with  . burning during urination    HPI Madison Cantu is a 29 y.o. female.   Pt complains of buring with urination.  Pt concerned that she could have a uti.  Pt reports she also used a latex condom and is allergic to latex.  Pt thinks this may have caused discomfort  The history is provided by the patient. No language interpreter was used.    Past Medical History:  Diagnosis Date  . Hemorrhoids     There are no problems to display for this patient.   History reviewed. No pertinent surgical history.  OB History   No obstetric history on file.      Home Medications    Prior to Admission medications   Medication Sig Start Date End Date Taking? Authorizing Provider  albuterol (PROVENTIL HFA;VENTOLIN HFA) 108 (90 Base) MCG/ACT inhaler Inhale 2 puffs into the lungs every 6 (six) hours as needed for wheezing or shortness of breath. 11/18/17   Loletta Specter, PA-C  metFORMIN (GLUCOPHAGE) 500 MG tablet Take 1 tablet (500 mg total) by mouth 3 (three) times daily. 04/27/18 04/26/19  Loletta Specter, PA-C  Norgestimate-Ethinyl Estradiol Triphasic (ORTHO TRI-CYCLEN, 28,) 0.18/0.215/0.25 MG-35 MCG tablet Take 1 tablet by mouth daily. Patient not taking: Reported on 04/27/2018 11/18/17 02/07/19  Loletta Specter, PA-C  omeprazole (PRILOSEC) 40 MG capsule Take 1 capsule (40 mg total) by mouth daily. 04/27/18 04/26/19  Loletta Specter, PA-C    Family History Family History  Problem Relation Age of Onset  . Hypertension Mother   . Hypertension Father   . Hypertension Brother     Social History Social History   Tobacco Use  . Smoking status: Former Smoker    Types: Cigarettes  . Smokeless tobacco: Current User  . Tobacco comment: 2 1/2 months   Vaping Use  . Vaping Use: Never used  Substance Use Topics  . Alcohol use:  No  . Drug use: Never     Allergies   Citrus, Ciprocinonide [fluocinolone], Clindamycin/lincomycin, Latex, Penicillins, Shellfish allergy, and Aspirin   Review of Systems Review of Systems  All other systems reviewed and are negative.    Physical Exam Triage Vital Signs ED Triage Vitals  Enc Vitals Group     BP 04/12/20 1148 (!) 153/106     Pulse Rate 04/12/20 1145 80     Resp 04/12/20 1145 20     Temp 04/12/20 1148 97.9 F (36.6 C)     Temp Source 04/12/20 1148 Oral     SpO2 04/12/20 1145 97 %     Weight --      Height --      Head Circumference --      Peak Flow --      Pain Score 04/12/20 1142 6     Pain Loc --      Pain Edu? --      Excl. in GC? --    No data found.  Updated Vital Signs BP (!) 153/106 (BP Location: Left Arm)   Pulse 80   Temp 97.9 F (36.6 C) (Oral)   Resp 20   LMP 03/26/2020 (Approximate)   SpO2 97%   Visual Acuity Right Eye Distance:   Left Eye Distance:   Bilateral Distance:    Right Eye Near:  Left Eye Near:    Bilateral Near:     Physical Exam Vitals reviewed.  Constitutional:      Appearance: Normal appearance.  Cardiovascular:     Rate and Rhythm: Normal rate.     Pulses: Normal pulses.  Abdominal:     General: Abdomen is flat.  Musculoskeletal:        General: Normal range of motion.     Cervical back: Normal range of motion.  Skin:    General: Skin is warm.  Neurological:     General: No focal deficit present.     Mental Status: She is alert.  Psychiatric:        Mood and Affect: Mood normal.      UC Treatments / Results  Labs (all labs ordered are listed, but only abnormal results are displayed) Labs Reviewed  POCT URINALYSIS DIPSTICK, ED / UC - Abnormal; Notable for the following components:      Result Value   Hgb urine dipstick TRACE (*)    Leukocytes,Ua TRACE (*)    All other components within normal limits  URINE CULTURE    EKG   Radiology No results found.  Procedures Procedures  (including critical care time)  Medications Ordered in UC Medications - No data to display  Initial Impression / Assessment and Plan / UC Course  I have reviewed the triage vital signs and the nursing notes.  Pertinent labs & imaging results that were available during my care of the patient were reviewed by me and considered in my medical decision making (see chart for details).      Final Clinical Impressions(s) / UC Diagnoses   Final diagnoses:  Allergic dermatitis     Discharge Instructions     Your urine culture is pending.  Apply a small amount of topical hydrocortisone to area of irritation twice a day for 2 days to see if this resolves symptoms.  Avoid latex    ED Prescriptions    None     PDMP not reviewed this encounter.   Elson Areas, [place] 04/13/20 1438

## 2020-05-21 ENCOUNTER — Ambulatory Visit (INDEPENDENT_AMBULATORY_CARE_PROVIDER_SITE_OTHER): Payer: Medicaid - Out of State | Admitting: Primary Care

## 2020-05-29 ENCOUNTER — Encounter (INDEPENDENT_AMBULATORY_CARE_PROVIDER_SITE_OTHER): Payer: Self-pay | Admitting: Primary Care

## 2020-05-29 ENCOUNTER — Other Ambulatory Visit: Payer: Self-pay

## 2020-05-29 ENCOUNTER — Telehealth (INDEPENDENT_AMBULATORY_CARE_PROVIDER_SITE_OTHER): Payer: Medicaid - Out of State | Admitting: Primary Care

## 2020-05-29 VITALS — BP 135/84 | HR 79

## 2020-05-29 DIAGNOSIS — G4452 New daily persistent headache (NDPH): Secondary | ICD-10-CM

## 2020-05-29 DIAGNOSIS — Z0130 Encounter for examination of blood pressure without abnormal findings: Secondary | ICD-10-CM | POA: Diagnosis not present

## 2020-05-29 NOTE — Progress Notes (Signed)
I connected with  Madison Cantu on 05/29/20 by a video enabled telemedicine application and verified that I am speaking with the correct person using two identifiers.   I discussed the limitations of evaluation and management by telemedicine. The patient expressed understanding and agreed to proceed. [name], CMA    135/84 BP today pt presented to clinic for appointment did not answer for yesterdays call to inform of virtual appointment. Unable to check bP at so was done in office  Complains of headache for about one week

## 2020-05-29 NOTE — Progress Notes (Signed)
Telephone Note  I connected with Madison Cantu on 05/29/20 at  3:50 PM EST by telephone and verified that I am speaking with the correct person using two identifiers.  Location: Patient: home Provider: Grayce Sessions working from home    I discussed the limitations, risks, security and privacy concerns of performing an evaluation and management service by telephone and the availability of in person appointments. I also discussed with the patient that there may be a patient responsible charge related to this service. The patient expressed understanding and agreed to proceed.   History of Present Illness: [name] 135/84 BP today pt presented to clinic for appointment did not answer for yesterdays call to inform of virtual appointment. Unable to check bP at so was done in office  Complains of headache for about one week. Denies shortness of breath, chest pain or lower extremity edema  Past Medical History:  Diagnosis Date  . Hemorrhoids    Current Outpatient Medications on File Prior to Visit  Medication Sig Dispense Refill  . albuterol (PROVENTIL HFA;VENTOLIN HFA) 108 (90 Base) MCG/ACT inhaler Inhale 2 puffs into the lungs every 6 (six) hours as needed for wheezing or shortness of breath. 1 Inhaler 2  . [DISCONTINUED] metFORMIN (GLUCOPHAGE) 500 MG tablet Take 1 tablet (500 mg total) by mouth 3 (three) times daily. 90 tablet 1  . [DISCONTINUED] Norgestimate-Ethinyl Estradiol Triphasic (ORTHO TRI-CYCLEN, 28,) 0.18/0.215/0.25 MG-35 MCG tablet Take 1 tablet by mouth daily. (Patient not taking: Reported on 04/27/2018) 1 Package 11  . [DISCONTINUED] omeprazole (PRILOSEC) 40 MG capsule Take 1 capsule (40 mg total) by mouth daily. 30 capsule 3   No current facility-administered medications on file prior to visit.    Observations/Objective: Pertinent positive and negative listed in HPI  Assessment and Plan: Madison Cantu was seen today for blood pressure check.  Diagnoses and all  orders for this visit:  Blood pressure check DASH Eating Plan DASH stands for "Dietary Approaches to Stop Hypertension." The DASH eating plan is a healthy eating plan that has been shown to reduce high blood pressure (hypertension). Additional health benefits may include reducing the risk of type 2 diabetes mellitus, heart disease, and stroke. The DASH eating plan may also help with weight loss. Lifestyle modifications with diet and exercise.   New daily persistent headache Headache Management:   Cool Compress. Lie down and place a cool compress on your head.   Avoid headache triggers. If certain foods or odors seem to have triggered your migraines in the past, avoid them. A headache diary might help you identify triggers.   Include physical activity in your daily routine. Try a daily walk or other moderate aerobic exercise.   Manage stress. Find healthy ways to cope with the stressors, such as delegating tasks on your to-do list.   Practice relaxation techniques. Try deep breathing, yoga, massage and visualization.   Eat regularly. Eating regularly scheduled meals and maintaining a healthy diet might help prevent headaches. Also, drink plenty of fluids.   Follow a regular sleep schedule. Sleep deprivation might contribute to headaches  Consider biofeedback. With this mind-body technique, you learn to control certain bodily functions -- such as muscle tension, heart rate and blood pressure -- to prevent headaches or reduce headache pain.  Headache log with possible triggers, activities  , menstrual cycle   Follow Up Instructions:    I discussed the assessment and treatment plan with the patient. The patient was provided an opportunity to ask questions and  all were answered. The patient agreed with the plan and demonstrated an understanding of the instructions.   The patient was advised to call back or seek an in-person evaluation if the symptoms worsen or if the condition fails to  improve as anticipated.  I provided  15 minutes of non-face-to-face time during this encounter.   Grayce Sessions, NP

## 2020-07-08 ENCOUNTER — Encounter (INDEPENDENT_AMBULATORY_CARE_PROVIDER_SITE_OTHER): Payer: Self-pay | Admitting: Primary Care

## 2020-07-08 ENCOUNTER — Other Ambulatory Visit: Payer: Self-pay

## 2020-07-08 ENCOUNTER — Other Ambulatory Visit (HOSPITAL_COMMUNITY)
Admission: RE | Admit: 2020-07-08 | Discharge: 2020-07-08 | Disposition: A | Discharge status: Home / Self Care | Payer: Medicaid - Out of State | Source: Ambulatory Visit | Attending: Primary Care | Admitting: Primary Care

## 2020-07-08 ENCOUNTER — Ambulatory Visit (INDEPENDENT_AMBULATORY_CARE_PROVIDER_SITE_OTHER): Payer: Self-pay | Admitting: Primary Care

## 2020-07-08 VITALS — BP 155/90 | HR 74 | Temp 97.7°F | Ht 64.0 in | Wt 334.8 lb

## 2020-07-08 DIAGNOSIS — I10 Essential (primary) hypertension: Secondary | ICD-10-CM

## 2020-07-08 DIAGNOSIS — Z113 Encounter for screening for infections with a predominantly sexual mode of transmission: Secondary | ICD-10-CM

## 2020-07-08 DIAGNOSIS — Z124 Encounter for screening for malignant neoplasm of cervix: Secondary | ICD-10-CM

## 2020-07-08 MED ORDER — HYDROCHLOROTHIAZIDE 25 MG PO TABS: 25.0000 mg | ORAL_TABLET | Freq: Every day | ORAL | 3 refills | Status: AC

## 2020-07-08 MED ORDER — AMLODIPINE BESYLATE 10 MG PO TABS: 10.0000 mg | ORAL_TABLET | Freq: Every day | ORAL | 3 refills | Status: DC

## 2020-07-08 NOTE — Progress Notes (Signed)
GYNECOLOGY ANNUAL PREVENTATIVE CARE ENCOUNTER NOTE  Subjective:   Madison Cantu is a 30 y.o. No obstetric history on file. female here for a routine annual gynecologic exam.  Current complaints: none.   Denies abnormal vaginal bleeding, discharge, pelvic pain, problems with intercourse or other gynecologic concerns.    Gynecologic History Patient's last menstrual period was 07/07/2020 (exact date). Contraception: none Obstetric History OB History  No obstetric history on file.    Past Medical History:  Diagnosis Date  . Hemorrhoids     No past surgical history on file.  Current Outpatient Medications on File Prior to Visit  Medication Sig Dispense Refill  . albuterol (PROVENTIL HFA;VENTOLIN HFA) 108 (90 Base) MCG/ACT inhaler Inhale 2 puffs into the lungs every 6 (six) hours as needed for wheezing or shortness of breath. 1 Inhaler 2  . [DISCONTINUED] metFORMIN (GLUCOPHAGE) 500 MG tablet Take 1 tablet (500 mg total) by mouth 3 (three) times daily. 90 tablet 1  . [DISCONTINUED] Norgestimate-Ethinyl Estradiol Triphasic (ORTHO TRI-CYCLEN, 28,) 0.18/0.215/0.25 MG-35 MCG tablet Take 1 tablet by mouth daily. (Patient not taking: Reported on 04/27/2018) 1 Package 11  . [DISCONTINUED] omeprazole (PRILOSEC) 40 MG capsule Take 1 capsule (40 mg total) by mouth daily. 30 capsule 3   No current facility-administered medications on file prior to visit.    Allergies  Allergen Reactions  . Citrus Anaphylaxis  . Ciprocinonide [Fluocinolone]   . Clindamycin/Lincomycin Hives  . Latex   . Penicillins Itching  . Shellfish Allergy   . Aspirin Rash    Social History   Socioeconomic History  . Marital status: Significant Other    Spouse name: Not on file  . Number of children: Not on file  . Years of education: Not on file  . Highest education level: Not on file  Occupational History  . Not on file  Tobacco Use  . Smoking status: Former Smoker    Types: Cigarettes  . Smokeless tobacco:  Current User  . Tobacco comment: 2 1/2 months   Vaping Use  . Vaping Use: Never used  Substance and Sexual Activity  . Alcohol use: No  . Drug use: Never  . Sexual activity: Yes    Partners: Female    Birth control/protection: None  Other Topics Concern  . Not on file  Social History Narrative  . Not on file   Social Determinants of Health   Financial Resource Strain: Not on file  Food Insecurity: Not on file  Transportation Needs: Not on file  Physical Activity: Not on file  Stress: Not on file  Social Connections: Not on file  Intimate Partner Violence: Not on file    Family History  Problem Relation Age of Onset  . Hypertension Mother   . Hypertension Father   . Hypertension Brother     The following portions of the patient's history were reviewed and updated as appropriate: allergies, current medications, past family history, past medical history, past social history, past surgical history and problem list.  Review of Systems Pertinent positive and negative noted in HPI   Objective:  LMP 07/07/2020 (Exact Date)  CONSTITUTIONAL: Well-developed, well-nourished morbid obese female in no acute distress.  HENT:  Normocephalic, atraumatic, External right and left ear normal.  EYES: Conjunctivae and EOM are normal. Pupils are equal, round, and reactive to light. NECK: Normal range of motion, supple(thick), no masses.  Normal thyroid.  SKIN: Skin is warm and dry. No rash noted. Not diaphoretic. No erythema. No pallor. NEUROLOGIC: Alert and  oriented to person, place, and time. Normal reflexes, muscle tone coordination. No cranial nerve deficit noted. PSYCHIATRIC: Normal mood and affect. Normal behavior. Normal judgment and thought content. CARDIOVASCULAR: Normal heart rate noted, regular rhythm RESPIRATORY: Clear to auscultation bilaterally. Effort and breath sounds normal, no problems with respiration noted. BREASTS: Taught SBE ABDOMEN: Soft, normal bowel sounds, no  distention noted.  No tenderness, rebound or guarding.  PELVIC: Normal appearing external genitalia; normal appearing vaginal mucosa and cervix.  No abnormal discharge noted.  Pap smear obtained.  Normal uterine size, no other palpable masses, no uterine or adnexal tenderness. MUSCULOSKELETAL: Normal range of motion. No tenderness.  No cyanosis, clubbing, or edema.  2+ distal pulses.   Assessment:  Annual gynecologic examination with pap smear   Plan:  Will follow up results of pap smear and manage accordingly.  Routine preventative health maintenance measures emphasized. Please refer to After Visit Summary for other counseling recommendations.

## 2020-07-08 NOTE — Patient Instructions (Signed)

## 2020-07-10 ENCOUNTER — Other Ambulatory Visit (INDEPENDENT_AMBULATORY_CARE_PROVIDER_SITE_OTHER): Payer: Self-pay | Admitting: Primary Care

## 2020-07-10 DIAGNOSIS — N760 Acute vaginitis: Secondary | ICD-10-CM

## 2020-07-10 LAB — CERVICOVAGINAL ANCILLARY ONLY
Bacterial Vaginitis (gardnerella): POSITIVE — AB
Candida Glabrata: NEGATIVE
Candida Vaginitis: NEGATIVE
Chlamydia: NEGATIVE
Comment: NEGATIVE
Comment: NEGATIVE
Comment: NEGATIVE
Comment: NEGATIVE
Comment: NEGATIVE
Comment: NORMAL
Neisseria Gonorrhea: NEGATIVE
Trichomonas: NEGATIVE

## 2020-07-10 MED ORDER — METRONIDAZOLE 500 MG PO TABS: 500.0000 mg | ORAL_TABLET | Freq: Two times a day (BID) | ORAL | 0 refills | Status: DC

## 2020-07-12 LAB — CYTOLOGY - PAP
Adequacy: ABSENT
Comment: NEGATIVE
Diagnosis: NEGATIVE
HSV1: NEGATIVE
HSV2: NEGATIVE

## 2020-08-05 ENCOUNTER — Ambulatory Visit (INDEPENDENT_AMBULATORY_CARE_PROVIDER_SITE_OTHER): Payer: Medicaid - Out of State | Admitting: Primary Care

## 2020-08-07 ENCOUNTER — Ambulatory Visit (INDEPENDENT_AMBULATORY_CARE_PROVIDER_SITE_OTHER): Payer: Medicaid - Out of State | Admitting: Primary Care

## 2020-08-27 ENCOUNTER — Ambulatory Visit (INDEPENDENT_AMBULATORY_CARE_PROVIDER_SITE_OTHER): Payer: Medicaid - Out of State | Admitting: Primary Care

## 2020-10-08 ENCOUNTER — Other Ambulatory Visit: Payer: Self-pay

## 2020-10-08 ENCOUNTER — Ambulatory Visit (HOSPITAL_COMMUNITY)
Admission: EM | Admit: 2020-10-08 | Discharge: 2020-10-08 | Disposition: A | Discharge status: Home / Self Care | Payer: Self-pay | Attending: Physician Assistant | Admitting: Physician Assistant

## 2020-10-08 ENCOUNTER — Encounter (HOSPITAL_COMMUNITY): Payer: Self-pay

## 2020-10-08 DIAGNOSIS — Z113 Encounter for screening for infections with a predominantly sexual mode of transmission: Secondary | ICD-10-CM | POA: Insufficient documentation

## 2020-10-08 DIAGNOSIS — B9689 Other specified bacterial agents as the cause of diseases classified elsewhere: Secondary | ICD-10-CM | POA: Insufficient documentation

## 2020-10-08 DIAGNOSIS — N760 Acute vaginitis: Secondary | ICD-10-CM | POA: Insufficient documentation

## 2020-10-08 DIAGNOSIS — N898 Other specified noninflammatory disorders of vagina: Secondary | ICD-10-CM | POA: Insufficient documentation

## 2020-10-08 MED ORDER — METRONIDAZOLE 500 MG PO TABS: 500.0000 mg | ORAL_TABLET | Freq: Two times a day (BID) | ORAL | 0 refills | Status: DC

## 2020-10-08 NOTE — Discharge Instructions (Addendum)
We are treating you for bacterial vaginosis.  Please take metronidazole twice daily for 1 week. This can cause you to vomit so do not drink any alcohol while taking this medication and for 72 hours after completing course.  Use hypoallergenic soaps and detergents.  Wear loosefitting cotton underwear.  We will be in touch with your results in a few days if there is any additional treatment that we need to arrange.  Please follow-up with PCP.

## 2020-10-08 NOTE — ED Provider Notes (Signed)
[facility]    CSN: [id number] Arrival date & time: 10/08/20  0807      History   Chief Complaint Chief Complaint  Patient presents with  . Vaginal Discharge    HPI Madison Cantu is a 30 y.o. female.   Patient presents today with a 1 month history of increased vaginal discharge.  She has a history of bacterial vaginosis treated with metronidazole and reports symptoms are similar to previous episodes of this condition.  She describes discharge as thin, white, malodorous.  She denies any recent antibiotic use or medication changes.  Was last treated approximately 3 months ago.  She does report changing her soap and believes this could have triggered symptoms.  She is sexually active and open to STI testing.  She has no concern for pregnancy.  Denies any fever, nausea, vomiting, pelvic pain, abdominal pain, urinary symptoms.     Past Medical History:  Diagnosis Date  . Hemorrhoids     There are no problems to display for this patient.   History reviewed. No pertinent surgical history.  OB History   No obstetric history on file.      Home Medications    Prior to Admission medications   Medication Sig Start Date End Date Taking? Authorizing Provider  albuterol (PROVENTIL HFA;VENTOLIN HFA) 108 (90 Base) MCG/ACT inhaler Inhale 2 puffs into the lungs every 6 (six) hours as needed for wheezing or shortness of breath. 11/18/17   Loletta Specter, PA-C  amLODipine (NORVASC) 10 MG tablet Take 1 tablet (10 mg total) by mouth daily. 07/08/20   Grayce Sessions, NP  hydrochlorothiazide (HYDRODIURIL) 25 MG tablet Take 1 tablet (25 mg total) by mouth daily. 07/08/20   Grayce Sessions, NP  metroNIDAZOLE (FLAGYL) 500 MG tablet Take 1 tablet (500 mg total) by mouth 2 (two) times daily. 10/08/20   Bettyanne Dittman, Noberto Retort, PA-C  metFORMIN (GLUCOPHAGE) 500 MG tablet Take 1 tablet (500 mg total) by mouth 3 (three) times daily. 04/27/18 04/26/19  Loletta Specter, PA-C   Norgestimate-Ethinyl Estradiol Triphasic (ORTHO TRI-CYCLEN, 28,) 0.18/0.215/0.25 MG-35 MCG tablet Take 1 tablet by mouth daily. Patient not taking: Reported on 04/27/2018 11/18/17 02/07/19  Loletta Specter, PA-C  omeprazole (PRILOSEC) 40 MG capsule Take 1 capsule (40 mg total) by mouth daily. 04/27/18 04/26/19  Loletta Specter, PA-C    Family History Family History  Problem Relation Age of Onset  . Hypertension Mother   . Hypertension Father   . Hypertension Brother     Social History Social History   Tobacco Use  . Smoking status: Former Smoker    Types: Cigarettes  . Smokeless tobacco: Current User  . Tobacco comment: 2 1/2 months   Vaping Use  . Vaping Use: Never used  Substance Use Topics  . Alcohol use: No  . Drug use: Never     Allergies   Citrus, Ciprocinonide [fluocinolone], Clindamycin/lincomycin, Latex, Penicillins, Shellfish allergy, and Aspirin   Review of Systems Review of Systems  Constitutional: Negative for activity change, appetite change, fatigue and fever.  Respiratory: Negative for cough and shortness of breath.   Cardiovascular: Negative for chest pain.  Gastrointestinal: Negative for abdominal pain, diarrhea, nausea and vomiting.  Genitourinary: Positive for vaginal discharge. Negative for dysuria, frequency, pelvic pain, vaginal bleeding and vaginal pain.  Neurological: Negative for dizziness, light-headedness and headaches.     Physical Exam Triage Vital Signs ED Triage Vitals  Enc Vitals Group     BP 10/08/20 0904 Marland Kitchen)  139/99     Pulse Rate 10/08/20 0904 81     Resp 10/08/20 0904 20     Temp 10/08/20 0904 97.9 F (36.6 C)     Temp Source 10/08/20 0904 Oral     SpO2 10/08/20 0904 100 %     Weight --      Height --      Head Circumference --      Peak Flow --      Pain Score 10/08/20 0902 0     Pain Loc --      Pain Edu? --      Excl. in GC? --    No data found.  Updated Vital Signs BP (!) 139/99 (BP Location: Right Wrist)    Pulse 81   Temp 97.9 F (36.6 C) (Oral)   Resp 20   LMP 09/15/2020 (Exact Date)   SpO2 100%   Visual Acuity Right Eye Distance:   Left Eye Distance:   Bilateral Distance:    Right Eye Near:   Left Eye Near:    Bilateral Near:     Physical Exam Vitals reviewed.  Constitutional:      General: She is awake. She is not in acute distress.    Appearance: Normal appearance. She is not ill-appearing.     Comments: Very pleasant female appears stated age in no acute distress  HENT:     Head: Normocephalic and atraumatic.  Cardiovascular:     Rate and Rhythm: Normal rate and regular rhythm.     Heart sounds: No murmur heard.   Pulmonary:     Effort: Pulmonary effort is normal.     Breath sounds: Normal breath sounds. No wheezing, rhonchi or rales.     Comments: Clear to auscultation bilaterally Abdominal:     General: Bowel sounds are normal.     Palpations: Abdomen is soft.     Tenderness: There is no abdominal tenderness. There is no right CVA tenderness, left CVA tenderness, guarding or rebound.     Comments: Benign abdominal exam  Genitourinary:    Comments: Exam deferred Psychiatric:        Behavior: Behavior is cooperative.      UC Treatments / Results  Labs (all labs ordered are listed, but only abnormal results are displayed) Labs Reviewed  CERVICOVAGINAL ANCILLARY ONLY    EKG   Radiology No results found.  Procedures Procedures (including critical care time)  Medications Ordered in UC Medications - No data to display  Initial Impression / Assessment and Plan / UC Course  I have reviewed the triage vital signs and the nursing notes.  Pertinent labs & imaging results that were available during my care of the patient were reviewed by me and considered in my medical decision making (see chart for details).     Patient apparently treated for bacterial vaginosis given clinical presentation with metronidazole.  She was instructed not to drink any  alcohol while taking this medication due to Antabuse side effects.  Recommended she wear cotton loosefitting underwear and use hypoallergenic soaps and detergents.  STI swab collected today-results pending.  Will contact patient if additional treatment is required.  Strict return precautions given to which patient expressed understanding.  Final Clinical Impressions(s) / UC Diagnoses   Final diagnoses:  Vaginal discharge  BV (bacterial vaginosis)  Routine screening for STI (sexually transmitted infection)     Discharge Instructions     We are treating you for bacterial vaginosis.  Please take metronidazole twice  daily for 1 week. This can cause you to vomit so do not drink any alcohol while taking this medication and for 72 hours after completing course.  Use hypoallergenic soaps and detergents.  Wear loosefitting cotton underwear.  We will be in touch with your results in a few days if there is any additional treatment that we need to arrange.  Please follow-up with PCP.    ED Prescriptions    Medication Sig Dispense Auth. Provider   metroNIDAZOLE (FLAGYL) 500 MG tablet Take 1 tablet (500 mg total) by mouth 2 (two) times daily. 14 tablet Josuha Fontanez, Noberto Retort, PA-C     PDMP not reviewed this encounter.   Jeani Hawking, PA-C 10/08/20 0712

## 2020-10-08 NOTE — ED Triage Notes (Signed)
Pt reports white vaginal discharge x 1 month.

## 2020-10-09 LAB — CERVICOVAGINAL ANCILLARY ONLY
Bacterial Vaginitis (gardnerella): POSITIVE — AB
Candida Glabrata: NEGATIVE
Candida Vaginitis: NEGATIVE
Chlamydia: NEGATIVE
Comment: NEGATIVE
Comment: NEGATIVE
Comment: NEGATIVE
Comment: NEGATIVE
Comment: NEGATIVE
Comment: NORMAL
Neisseria Gonorrhea: NEGATIVE
Trichomonas: NEGATIVE

## 2020-11-13 ENCOUNTER — Other Ambulatory Visit (HOSPITAL_COMMUNITY): Payer: Self-pay

## 2020-11-13 ENCOUNTER — Other Ambulatory Visit: Payer: Self-pay

## 2020-11-13 ENCOUNTER — Ambulatory Visit (HOSPITAL_COMMUNITY)
Admission: EM | Admit: 2020-11-13 | Discharge: 2020-11-13 | Disposition: A | Discharge status: Home / Self Care | Payer: Self-pay | Attending: Medical Oncology | Admitting: Medical Oncology

## 2020-11-13 ENCOUNTER — Encounter (HOSPITAL_COMMUNITY): Payer: Self-pay | Admitting: Emergency Medicine

## 2020-11-13 DIAGNOSIS — H9202 Otalgia, left ear: Secondary | ICD-10-CM

## 2020-11-13 DIAGNOSIS — K047 Periapical abscess without sinus: Secondary | ICD-10-CM

## 2020-11-13 MED ORDER — LEVOFLOXACIN 250 MG PO TABS: 750.0000 mg | ORAL_TABLET | Freq: Every day | ORAL | 0 refills | Status: DC

## 2020-11-13 MED ORDER — LEVOFLOXACIN 250 MG PO TABS
750.0000 mg | ORAL_TABLET | Freq: Every day | ORAL | 0 refills | Status: AC
Start: 2020-11-13 — End: 2020-12-11
  Filled 2020-11-13 – 2020-12-04 (×2): qty 21, 7d supply, fill #0

## 2020-11-13 NOTE — ED Triage Notes (Signed)
Patient has been in a pool 3-4 days ago.  Patient noticed left ear pain after being in pool  reports left side of face is hurting

## 2020-11-13 NOTE — ED Provider Notes (Signed)
[facility]    CSN: [id number] Arrival date & time: 11/13/20  0803      History   Chief Complaint Chief Complaint  Patient presents with   Otalgia    HPI Madison Cantu is a 30 y.o. female.   HPI  Otalgia: Pt reports that for the past 2 days she has had left ear pain. She reports swimming 3-4 days ago and thinks this contributed to symptoms. She has had a mild amount of "brownish" discharge along with mild left sided jaw and facial swelling. She denies fevers, decreased hearing, cold symptoms. She does report when questioned having dental pain on the left lower side. She has tried OTC pain medications for symptoms without much relief. *of note she has not taken her BP medications yet this morning but will do so when she gets home.   Past Medical History:  Diagnosis Date   Hemorrhoids     There are no problems to display for this patient.   History reviewed. No pertinent surgical history.  OB History   No obstetric history on file.      Home Medications    Prior to Admission medications   Medication Sig Start Date End Date Taking? Authorizing Provider  amLODipine (NORVASC) 10 MG tablet Take 1 tablet (10 mg total) by mouth daily. 07/08/20  Yes Grayce Sessions, NP  hydrochlorothiazide (HYDRODIURIL) 25 MG tablet Take 1 tablet (25 mg total) by mouth daily. 07/08/20  Yes Grayce Sessions, NP  albuterol (PROVENTIL HFA;VENTOLIN HFA) 108 (90 Base) MCG/ACT inhaler Inhale 2 puffs into the lungs every 6 (six) hours as needed for wheezing or shortness of breath. 11/18/17   Loletta Specter, PA-C  levofloxacin (LEVAQUIN) 250 MG tablet Take 3 tablets (750 mg total) by mouth daily for 7 days. 11/13/20 11/20/20  Rushie Chestnut, PA-C  metroNIDAZOLE (FLAGYL) 500 MG tablet Take 1 tablet (500 mg total) by mouth 2 (two) times daily. Patient not taking: Reported on 11/13/2020 10/08/20   Raspet, Noberto Retort, PA-C  metFORMIN (GLUCOPHAGE) 500 MG tablet Take 1 tablet (500 mg  total) by mouth 3 (three) times daily. 04/27/18 04/26/19  Loletta Specter, PA-C  Norgestimate-Ethinyl Estradiol Triphasic (ORTHO TRI-CYCLEN, 28,) 0.18/0.215/0.25 MG-35 MCG tablet Take 1 tablet by mouth daily. Patient not taking: Reported on 04/27/2018 11/18/17 02/07/19  Loletta Specter, PA-C  omeprazole (PRILOSEC) 40 MG capsule Take 1 capsule (40 mg total) by mouth daily. 04/27/18 04/26/19  Loletta Specter, PA-C    Family History Family History  Problem Relation Age of Onset   Hypertension Mother    Hypertension Father    Hypertension Brother     Social History Social History   Tobacco Use   Smoking status: Former    Pack years: 0.00    Types: Cigarettes   Smokeless tobacco: Current   Tobacco comments:    2 1/2 months   Vaping Use   Vaping Use: Never used  Substance Use Topics   Alcohol use: No   Drug use: Never     Allergies   Citrus, Ciprocinonide [fluocinolone], Clindamycin/lincomycin, Latex, Penicillins, Shellfish allergy, and Aspirin   Review of Systems Review of Systems  As stated above in HPI Physical Exam Triage Vital Signs ED Triage Vitals [11/13/20 0828]  Enc Vitals Group     BP      Pulse      Resp      Temp      Temp src  SpO2      Weight      Height      Head Circumference      Peak Flow      Pain Score 6     Pain Loc      Pain Edu?      Excl. in GC?    No data found.  Updated Vital Signs BP (!) 161/88 (BP Location: Left Arm) Comment: regular cuff, forearm  Pulse 70   Temp 98 F (36.7 C) (Oral)   Resp (!) 22   LMP 10/10/2020   SpO2 99%   Physical Exam Vitals and nursing note reviewed.  Constitutional:      General: She is not in acute distress.    Appearance: Normal appearance. She is obese. She is not ill-appearing, toxic-appearing or diaphoretic.  HENT:     Head: Normocephalic and atraumatic.     Right Ear: Tympanic membrane, ear canal and external ear normal.     Left Ear: Tympanic membrane, ear canal and external ear  normal.     Nose: Nose normal.     Mouth/Throat:     Mouth: Mucous membranes are dry.     Pharynx: Oropharynx is clear. Uvula midline.      Comments: NO edema of superior or inferior palate  Eyes:     Extraocular Movements: Extraocular movements intact.     Pupils: Pupils are equal, round, and reactive to light.  Cardiovascular:     Rate and Rhythm: Normal rate and regular rhythm.  Pulmonary:     Effort: Pulmonary effort is normal.     Breath sounds: Normal breath sounds.  Musculoskeletal:     Cervical back: Normal range of motion and neck supple.  Lymphadenopathy:     Cervical: No cervical adenopathy.  Skin:    General: Skin is warm.  Neurological:     Mental Status: She is alert.     UC Treatments / Results  Labs (all labs ordered are listed, but only abnormal results are displayed) Labs Reviewed - No data to display  EKG   Radiology No results found.  Procedures Procedures (including critical care time)  Medications Ordered in UC Medications - No data to display  Initial Impression / Assessment and Plan / UC Course  I have reviewed the triage vital signs and the nursing notes.  Pertinent labs & imaging results that were available during my care of the patient were reviewed by me and considered in my medical decision making (see chart for details).     New. Ears are ok but it appears that her pain is from her dental infection radiating to her ear. Due to her allergy to first and second line ABX she will be treated with 3rd line levaquin which we discussed. Recommended dental follow up and discussed red flag signs and symptoms.   Final Clinical Impressions(s) / UC Diagnoses   Final diagnoses:  Left ear pain  Dental infection   Discharge Instructions   None    ED Prescriptions     Medication Sig Dispense Auth. Provider   levofloxacin (LEVAQUIN) 250 MG tablet  (Status: Discontinued) Take 3 tablets (750 mg total) by mouth daily for 7 days. 21 tablet  Chany Woolworth M, PA-C   levofloxacin (LEVAQUIN) 250 MG tablet Take 3 tablets (750 mg total) by mouth daily for 7 days. 21 tablet Rushie Chestnut, [place]      PDMP not reviewed this encounter.   Rushie Chestnut, [place] 11/13/20 [phone]

## 2020-11-21 ENCOUNTER — Other Ambulatory Visit (HOSPITAL_COMMUNITY): Payer: Self-pay

## 2020-12-04 ENCOUNTER — Other Ambulatory Visit (HOSPITAL_COMMUNITY): Payer: Self-pay

## 2020-12-04 ENCOUNTER — Other Ambulatory Visit: Payer: Self-pay

## 2020-12-05 ENCOUNTER — Emergency Department (HOSPITAL_COMMUNITY)
Admission: EM | Admit: 2020-12-05 | Discharge: 2020-12-06 | Disposition: A | Discharge status: Home / Self Care | Payer: Self-pay | Attending: Emergency Medicine | Admitting: Emergency Medicine

## 2020-12-05 DIAGNOSIS — Z91040 Latex allergy status: Secondary | ICD-10-CM | POA: Insufficient documentation

## 2020-12-05 DIAGNOSIS — Z87891 Personal history of nicotine dependence: Secondary | ICD-10-CM | POA: Insufficient documentation

## 2020-12-05 DIAGNOSIS — K0889 Other specified disorders of teeth and supporting structures: Secondary | ICD-10-CM | POA: Insufficient documentation

## 2020-12-05 NOTE — ED Provider Notes (Signed)
Emergency Medicine Provider Triage Evaluation Note  Madison Cantu , a 30 y.o. female  was evaluated in triage.  Pt complains of left low dental pain for the past few weeks. Patient seen at [facility] on 6/22 and discharged with Levaquin for a dental infection which she finished with no improvement in symptoms. Patient endorses left-sided cheek edema. No trismus, changes to phonation, or difficulties swallowing. She states she feels warm, but no documented fever. She recently moved here and does not currently have a dentist.  Review of Systems  Positive: Dental pain Negative: trismus  Physical Exam  BP (!) 157/112 (BP Location: Right Arm)   Pulse 83   Temp 98.3 F (36.8 C) (Oral)   Resp 18   SpO2 99%  Gen:   Awake, no distress   Resp:  Normal effort  MSK:   Moves extremities without difficulty  Other:  Missing left lower posterior molar. No abscess. No trismus. No cheek edema.   Medical Decision Making  Medically screening exam initiated at 8:52 PM.  Appropriate orders placed.  Madison Cantu was informed that the remainder of the evaluation will be completed by another provider, this initial triage assessment does not replace that evaluation, and the importance of remaining in the ED until their evaluation is complete.  No obvious abscess. Low suspicion for ludwig's or deep space infection. Suspect pain related to possible exposed root.    Madison Cantu 12/05/20 2055    Madison Crease, MD 12/06/20 [phone]

## 2020-12-05 NOTE — ED Triage Notes (Addendum)
Pt presents with left sided dental pain and swelling  Ibuprofen and tylenol arent helping.  Has been on levaquin for 2 days from urgent care.

## 2020-12-06 MED ORDER — HYDROCODONE-ACETAMINOPHEN 5-325 MG PO TABS
1.0000 | ORAL_TABLET | ORAL | 0 refills | Status: AC | PRN
Start: 2020-12-06 — End: ?

## 2020-12-06 MED ORDER — OXYCODONE-ACETAMINOPHEN 5-325 MG PO TABS
1.0000 | ORAL_TABLET | Freq: Once | ORAL | Status: AC
  Administered 2020-12-06: 1 via ORAL
  Filled 2020-12-06: qty 1

## 2020-12-06 NOTE — ED Provider Notes (Signed)
[facility] EMERGENCY DEPARTMENT Provider Note   CSN: [id number] Arrival date & time: 12/05/20  2013     History Chief Complaint  Patient presents with   Dental Pain    Madison Cantu is a 30 y.o. female.  Patient presents to the emergency department for evaluation of dental pain.  Patient reports that she has been having pain on the left lower side of her mouth with swelling.  Patient was seen in urgent care and was started on Levaquin.  She has been using Tylenol and ibuprofen but the pain has not improved.  Patient experiencing severe, constant pain.  She is tearful due to the pain.      Past Medical History:  Diagnosis Date   Hemorrhoids     There are no problems to display for this patient.   No past surgical history on file.   OB History   No obstetric history on file.     Family History  Problem Relation Age of Onset   Hypertension Mother    Hypertension Father    Hypertension Brother     Social History   Tobacco Use   Smoking status: Former    Types: Cigarettes   Smokeless tobacco: Current   Tobacco comments:    2 1/2 months   Vaping Use   Vaping Use: Never used  Substance Use Topics   Alcohol use: No   Drug use: Never    Home Medications Prior to Admission medications   Medication Sig Start Date End Date Taking? Authorizing Provider  HYDROcodone-acetaminophen (NORCO/VICODIN) 5-325 MG tablet Take 1 tablet by mouth every 4 (four) hours as needed for moderate pain. 12/06/20  Yes Edlin Ford, Canary Brim, MD  albuterol (PROVENTIL HFA;VENTOLIN HFA) 108 (90 Base) MCG/ACT inhaler Inhale 2 puffs into the lungs every 6 (six) hours as needed for wheezing or shortness of breath. 11/18/17   Loletta Specter, PA-C  amLODipine (NORVASC) 10 MG tablet Take 1 tablet (10 mg total) by mouth daily. 07/08/20   Grayce Sessions, NP  hydrochlorothiazide (HYDRODIURIL) 25 MG tablet Take 1 tablet (25 mg total) by mouth daily. 07/08/20   Grayce Sessions, NP  levofloxacin (LEVAQUIN) 250 MG tablet Take 3 tablets (750 mg total) by mouth daily for 7 days. 11/13/20 12/11/20  Rushie Chestnut, PA-C  metFORMIN (GLUCOPHAGE) 500 MG tablet Take 1 tablet (500 mg total) by mouth 3 (three) times daily. 04/27/18 04/26/19  Loletta Specter, PA-C  Norgestimate-Ethinyl Estradiol Triphasic (ORTHO TRI-CYCLEN, 28,) 0.18/0.215/0.25 MG-35 MCG tablet Take 1 tablet by mouth daily. Patient not taking: Reported on 04/27/2018 11/18/17 02/07/19  Loletta Specter, PA-C  omeprazole (PRILOSEC) 40 MG capsule Take 1 capsule (40 mg total) by mouth daily. 04/27/18 04/26/19  Loletta Specter, PA-C    Allergies    Citrus, Ciprocinonide [fluocinolone], Clindamycin/lincomycin, Latex, Penicillins, Shellfish allergy, and Aspirin  Review of Systems   Review of Systems  HENT:  Positive for dental problem.   All other systems reviewed and are negative.  Physical Exam Updated Vital Signs BP (!) 157/112 (BP Location: Right Arm)   Pulse 83   Temp 98.3 F (36.8 C) (Oral)   Resp 18   SpO2 99%   Physical Exam Vitals and nursing note reviewed.  Constitutional:      General: She is in acute distress (tearful).     Appearance: Normal appearance. She is well-developed.  HENT:     Head: Normocephalic and atraumatic.     Right Ear:  Hearing normal.     Left Ear: Hearing normal.     Nose: Nose normal.     Mouth/Throat:     Dentition: Abnormal dentition. Dental tenderness and dental caries present. No dental abscesses.  Eyes:     Conjunctiva/sclera: Conjunctivae normal.     Pupils: Pupils are equal, round, and reactive to light.  Cardiovascular:     Rate and Rhythm: Regular rhythm.     Heart sounds: S1 normal and S2 normal. No murmur heard.   No friction rub. No gallop.  Pulmonary:     Effort: Pulmonary effort is normal. No respiratory distress.     Breath sounds: Normal breath sounds.  Chest:     Chest wall: No tenderness.  Abdominal:     General: Bowel sounds  are normal.     Palpations: Abdomen is soft.     Tenderness: There is no abdominal tenderness. There is no guarding or rebound. Negative signs include Murphy's sign and McBurney's sign.     Hernia: No hernia is present.  Musculoskeletal:        General: Normal range of motion.     Cervical back: Normal range of motion and neck supple.  Skin:    General: Skin is warm and dry.     Findings: No rash.  Neurological:     Mental Status: She is alert and oriented to person, place, and time.     GCS: GCS eye subscore is 4. GCS verbal subscore is 5. GCS motor subscore is 6.     Cranial Nerves: No cranial nerve deficit.     Sensory: No sensory deficit.     Coordination: Coordination normal.  Psychiatric:        Speech: Speech normal.        Behavior: Behavior normal.        Thought Content: Thought content normal.    ED Results / Procedures / Treatments   Labs (all labs ordered are listed, but only abnormal results are displayed) Labs Reviewed - No data to display  EKG None  Radiology No results found.  Procedures Procedures   Medications Ordered in ED Medications  oxyCODONE-acetaminophen (PERCOCET/ROXICET) 5-325 MG per tablet 1 tablet (has no administration in time range)    ED Course  I have reviewed the triage vital signs and the nursing notes.  Pertinent labs & imaging results that were available during my care of the patient were reviewed by me and considered in my medical decision making (see chart for details).    MDM Rules/Calculators/A&P                          Patient with severe dental pain not improved with OTC pain meds and antibiotics.  Examination reveals poor dentition.  No intraoral swelling or fluctuance.  No facial swelling.  Vital signs unremarkable, no fever, tachycardia.  No concern for deep space infection.  Agree with antibiotics, however, pain likely not infectious.  Counseled patient that she does need to see a dentist.  Final Clinical  Impression(s) / ED Diagnoses Final diagnoses:  Pain, dental    Rx / DC Orders ED Discharge Orders          Ordered    HYDROcodone-acetaminophen (NORCO/VICODIN) 5-325 MG tablet  Every 4 hours PRN        12/06/20 0045             Gilda Crease, MD 12/06/20 [phone]

## 2021-01-21 ENCOUNTER — Other Ambulatory Visit: Payer: Self-pay

## 2021-01-21 ENCOUNTER — Ambulatory Visit (INDEPENDENT_AMBULATORY_CARE_PROVIDER_SITE_OTHER): Payer: Self-pay | Admitting: *Deleted

## 2021-01-21 ENCOUNTER — Emergency Department (HOSPITAL_BASED_OUTPATIENT_CLINIC_OR_DEPARTMENT_OTHER): Payer: Self-pay

## 2021-01-21 ENCOUNTER — Emergency Department (HOSPITAL_BASED_OUTPATIENT_CLINIC_OR_DEPARTMENT_OTHER)
Admission: EM | Admit: 2021-01-21 | Discharge: 2021-01-21 | Disposition: A | Discharge status: Home / Self Care | Payer: Self-pay | Attending: Emergency Medicine | Admitting: Emergency Medicine

## 2021-01-21 ENCOUNTER — Encounter (HOSPITAL_BASED_OUTPATIENT_CLINIC_OR_DEPARTMENT_OTHER): Payer: Self-pay | Admitting: *Deleted

## 2021-01-21 DIAGNOSIS — R0602 Shortness of breath: Secondary | ICD-10-CM

## 2021-01-21 DIAGNOSIS — J45909 Unspecified asthma, uncomplicated: Secondary | ICD-10-CM

## 2021-01-21 DIAGNOSIS — Z91040 Latex allergy status: Secondary | ICD-10-CM | POA: Insufficient documentation

## 2021-01-21 DIAGNOSIS — Z87891 Personal history of nicotine dependence: Secondary | ICD-10-CM | POA: Insufficient documentation

## 2021-01-21 DIAGNOSIS — Z20822 Contact with and (suspected) exposure to covid-19: Secondary | ICD-10-CM | POA: Insufficient documentation

## 2021-01-21 DIAGNOSIS — J40 Bronchitis, not specified as acute or chronic: Secondary | ICD-10-CM

## 2021-01-21 HISTORY — DX: Unspecified asthma, uncomplicated: J45.909

## 2021-01-21 MED ORDER — BENZONATATE 200 MG PO CAPS: 200.0000 mg | ORAL_CAPSULE | Freq: Three times a day (TID) | ORAL | 0 refills | Status: AC

## 2021-01-21 MED ORDER — ALBUTEROL SULFATE HFA 108 (90 BASE) MCG/ACT IN AERS: 2.0000 | INHALATION_SPRAY | Freq: Four times a day (QID) | RESPIRATORY_TRACT | 2 refills | Status: AC | PRN

## 2021-01-21 NOTE — ED Triage Notes (Addendum)
C/o SOB ,  pro hard cough, h/a  x 3 days , hx asthma , pt reports out of inhaler and requesting covid test

## 2021-01-21 NOTE — ED Notes (Signed)
Patient Alert and oriented to baseline. Stable and ambulatory to baseline. Patient verbalized understanding of the discharge instructions.  Patient belongings were taken by the patient.   

## 2021-01-21 NOTE — Discharge Instructions (Addendum)
Quarantine pending COVID test results. Use your inhaler as needed as prescribed.  Take Tessalon as needed as prescribed for cough. Recheck with your primary care provider if symptoms do not improve.

## 2021-01-21 NOTE — Telephone Encounter (Signed)
Pt's mother Madison Cantu called in but pt got on speaker phone with me.   Pt has been vomiting blood for 2 days.   Feeling dizzy and weak.   They went to the urgent care but was told they needed to be seen in the ED.   Due to the long wait they did not want to go to the ED.   They wanted to know if they could go to the ED on [address] and I let them know that was fine.   They were agreeable and going to [facility] ED now.

## 2021-01-21 NOTE — Telephone Encounter (Signed)
Reason for Disposition  Weak, dizzy or lightheaded    Vomiting blood for 2 days  Answer Assessment - Initial Assessment Questions 1. APPEARANCE of BLOOD: "What does the blood look like?" (e.g., color, coffee-grounds)     Mother called in. Madison Cantu got on phone)   Madison Cantu is vomiting blood.  She is having headaches and chest pain.   She has been vomiting blood for a couple of days and dizzy too.   She went to the urgent care but they told her she needed to go to the ED.   Mother called into [facility] to see if Madison Cantu could be seen there instead of the ED due to the long wait.   I let her know Madison Cantu needed to be seen in the ED.   Madison Cantu asked if the [facility] ED on [address] on [address] would be ok to go to?   I let her know yes to go on to that location they are a full working ED.  Madison Cantu's mother and Madison Cantu agreeable to this plan and going to [facility] ED now. 2. AMOUNT: "How much blood was lost?"     Vomiting blood for 2 days now  Referred to ED at this point. 3. VOMITING BLOOD: "How many times did it happen?" or "How many times in the past 24 hours?"     *No Answer* 4. VOMITING WITHOUT BLOOD: "How many times in the past 24 hours?"      *No Answer* 5. ONSET: "When did vomiting of blood begin?"     *No Answer* 6. CAUSE: "What do you think is causing the vomiting of blood?"     Not sure 7. BLOOD THINNERS: "Do you take any blood thinners?" (e.g., Coumadin/warfarin, Pradaxa/dabigatran, aspirin)     *No Answer* 8. DEHYDRATION: "Are there any signs of dehydration?" "When was the last time you urinated?" "Do you feel dizzy?"     Yes having dizziness 9. ABDOMINAL PAIN: "Are you having any abdominal pain?" If Yes, ask: "What does it feel like? " (e.g., crampy, dull, intermittent, constant)      *No Answer* 10. DIARRHEA: "Is there any diarrhea?" If Yes, ask: "How many times today?"        *No Answer* 11. OTHER SYMPTOMS: "Do you have any other symptoms?"  (e.g., fever, blood in stool)       *No Answer* 12. PREGNANCY: "Is there any chance you are pregnant?" "When was your last menstrual period?"       *No Answer*  Protocols used: Vomiting Blood-A-AH

## 2021-01-21 NOTE — Telephone Encounter (Signed)
FYI

## 2021-01-21 NOTE — ED Provider Notes (Signed)
[facility] EMERGENCY DEPARTMENT Provider Note   CSN: [id number] Arrival date & time: 01/21/21  1453     History Chief Complaint  Patient presents with   Shortness of Breath    Madison Cantu is a 30 y.o. female.  30 year old female with history of asthma presents with complaint of cough x 2 days, productive with flecks of blood in her mucous as well as 1 episode of vomiting today with same appearing blood in her emesis. Also left chest wall tenderness. Denies fevers, chills, sick contacts, body aches, abdominal pain, changes in bowel habits. No prior hospitalizations related to her asthma. Also requesting refill of inhaler. No other complaints or concerns.       Past Medical History:  Diagnosis Date   Asthma    Hemorrhoids     There are no problems to display for this patient.   History reviewed. No pertinent surgical history.   OB History   No obstetric history on file.     Family History  Problem Relation Age of Onset   Hypertension Mother    Hypertension Father    Hypertension Brother     Social History   Tobacco Use   Smoking status: Former    Types: Cigarettes   Smokeless tobacco: Current   Tobacco comments:    2 1/2 months   Vaping Use   Vaping Use: Never used  Substance Use Topics   Alcohol use: No   Drug use: Never    Home Medications Prior to Admission medications   Medication Sig Start Date End Date Taking? Authorizing Provider  benzonatate (TESSALON) 200 MG capsule Take 1 capsule (200 mg total) by mouth every 8 (eight) hours for 10 days. 01/21/21 01/31/21 Yes Jeannie Fend, PA-C  albuterol (VENTOLIN HFA) 108 (90 Base) MCG/ACT inhaler Inhale 2 puffs into the lungs every 6 (six) hours as needed for wheezing or shortness of breath. 01/21/21   Jeannie Fend, PA-C  amLODipine (NORVASC) 10 MG tablet Take 1 tablet (10 mg total) by mouth daily. 07/08/20   Grayce Sessions, NP  hydrochlorothiazide (HYDRODIURIL) 25 MG tablet Take 1  tablet (25 mg total) by mouth daily. 07/08/20   Grayce Sessions, NP  HYDROcodone-acetaminophen (NORCO/VICODIN) 5-325 MG tablet Take 1 tablet by mouth every 4 (four) hours as needed for moderate pain. 12/06/20   Gilda Crease, MD  metFORMIN (GLUCOPHAGE) 500 MG tablet Take 1 tablet (500 mg total) by mouth 3 (three) times daily. 04/27/18 04/26/19  Loletta Specter, PA-C  Norgestimate-Ethinyl Estradiol Triphasic (ORTHO TRI-CYCLEN, 28,) 0.18/0.215/0.25 MG-35 MCG tablet Take 1 tablet by mouth daily. Patient not taking: Reported on 04/27/2018 11/18/17 02/07/19  Loletta Specter, PA-C  omeprazole (PRILOSEC) 40 MG capsule Take 1 capsule (40 mg total) by mouth daily. 04/27/18 04/26/19  Loletta Specter, PA-C    Allergies    Citrus, Ciprocinonide [fluocinolone], Clindamycin/lincomycin, Latex, Penicillins, Shellfish allergy, and Aspirin  Review of Systems   Review of Systems  Constitutional:  Negative for chills, diaphoresis and fever.  HENT:  Positive for congestion. Negative for sore throat.   Respiratory:  Positive for cough and wheezing. Negative for shortness of breath.   Cardiovascular:  Negative for chest pain.  Gastrointestinal:  Positive for nausea and vomiting. Negative for abdominal pain, blood in stool, constipation and diarrhea.  Musculoskeletal:  Negative for arthralgias and myalgias.  Skin:  Negative for rash and wound.  Allergic/Immunologic: Negative for immunocompromised state.  Neurological:  Negative for weakness  and headaches.  Hematological:  Negative for adenopathy.  Psychiatric/Behavioral:  Negative for confusion.   All other systems reviewed and are negative.  Physical Exam Updated Vital Signs BP (!) 144/92 (BP Location: Right Arm)   Pulse 89   Temp 98.4 F (36.9 C) (Oral)   Resp 20   Ht 5\' 6"  (1.676 m)   Wt (!) 155.1 kg   LMP 01/16/2021   SpO2 97%   BMI 55.20 kg/m   Physical Exam Vitals and nursing note reviewed.  Constitutional:      General: She is  not in acute distress.    Appearance: She is well-developed. She is obese. She is not diaphoretic.  HENT:     Head: Normocephalic and atraumatic.     Mouth/Throat:     Mouth: Mucous membranes are moist.     Pharynx: No pharyngeal swelling or oropharyngeal exudate.  Cardiovascular:     Rate and Rhythm: Normal rate and regular rhythm.  Pulmonary:     Effort: Pulmonary effort is normal.     Breath sounds: Normal breath sounds.  Chest:     Chest wall: Tenderness present.    Musculoskeletal:     Cervical back: Neck supple.     Right lower leg: No edema.     Left lower leg: No edema.  Skin:    General: Skin is warm and dry.     Findings: No erythema or rash.  Neurological:     Mental Status: She is alert and oriented to person, place, and time.  Psychiatric:        Behavior: Behavior normal.    ED Results / Procedures / Treatments   Labs (all labs ordered are listed, but only abnormal results are displayed) Labs Reviewed  SARS CORONAVIRUS 2 (TAT 6-24 HRS)    EKG None  Radiology DG Chest Port 1 View  Result Date: 01/21/2021 CLINICAL DATA:  Shortness of breath, cough. EXAM: PORTABLE CHEST 1 VIEW COMPARISON:  March 11, 2018. FINDINGS: The heart size and mediastinal contours are within normal limits. Both lungs are clear. The visualized skeletal structures are unremarkable. IMPRESSION: No active disease. Electronically Signed   By: March 13, 2018 M.D.   On: 01/21/2021 16:12    Procedures Procedures   Medications Ordered in ED Medications - No data to display  ED Course  I have reviewed the triage vital signs and the nursing notes.  Pertinent labs & imaging results that were available during my care of the patient were reviewed by me and considered in my medical decision making (see chart for details).  Clinical Course as of 01/21/21 1620  Tue Jan 21, 2021  468 30 year old female with history of asthma presents with cough as above.  Concerned about flecks of blood  tinged in her mucus as well as 1 episode of emesis today.  No further emesis, no abdominal pain, no changes in bowel habits or dark or tarry stools. On exam, well-appearing, lungs are clear to auscultation.  Does have left-sided chest wall tenderness. Chest x-ray obtained due to concern for hemoptysis and is unremarkable.  Blood pressure is mildly elevated otherwise O2 sat reassuring at 97%. COVID test results pending.  Albuterol inhaler refilled, given prescription for Tessalon.  Recommend recheck with PCP as needed. [LM]    Clinical Course User Index [LM] 37   MDM Rules/Calculators/A&P  Final Clinical Impression(s) / ED Diagnoses Final diagnoses:  Bronchitis  Mild asthma without complication, unspecified whether persistent    Rx / DC Orders ED Discharge Orders          Ordered    albuterol (VENTOLIN HFA) 108 (90 Base) MCG/ACT inhaler  Every 6 hours PRN        01/21/21 1618    benzonatate (TESSALON) 200 MG capsule  Every 8 hours        01/21/21 1618             Jeannie Fend, PA-C 01/21/21 1620    Terrilee Files, MD 01/22/21 1144

## 2021-01-22 LAB — SARS CORONAVIRUS 2 (TAT 6-24 HRS): SARS Coronavirus 2: NEGATIVE

## 2021-03-17 ENCOUNTER — Ambulatory Visit (INDEPENDENT_AMBULATORY_CARE_PROVIDER_SITE_OTHER): Payer: Self-pay | Admitting: Primary Care

## 2021-03-18 ENCOUNTER — Ambulatory Visit (INDEPENDENT_AMBULATORY_CARE_PROVIDER_SITE_OTHER): Payer: Self-pay | Admitting: Primary Care

## 2021-03-18 ENCOUNTER — Encounter (INDEPENDENT_AMBULATORY_CARE_PROVIDER_SITE_OTHER): Payer: Self-pay

## 2021-07-03 ENCOUNTER — Encounter (HOSPITAL_COMMUNITY): Payer: Self-pay | Admitting: Emergency Medicine

## 2021-07-03 ENCOUNTER — Other Ambulatory Visit (HOSPITAL_COMMUNITY): Payer: Self-pay

## 2021-07-03 ENCOUNTER — Ambulatory Visit (HOSPITAL_COMMUNITY)
Admission: EM | Admit: 2021-07-03 | Discharge: 2021-07-03 | Disposition: A | Discharge status: Home / Self Care | Payer: Self-pay | Attending: Urgent Care | Admitting: Urgent Care

## 2021-07-03 DIAGNOSIS — J069 Acute upper respiratory infection, unspecified: Secondary | ICD-10-CM

## 2021-07-03 HISTORY — DX: Essential (primary) hypertension: I10

## 2021-07-03 MED ORDER — GUAIFENESIN ER 600 MG PO TB12
600.0000 mg | ORAL_TABLET | Freq: Two times a day (BID) | ORAL | 0 refills | Status: AC | PRN
  Filled 2021-07-03: qty 20, 10d supply, fill #0

## 2021-07-03 MED ORDER — MONTELUKAST SODIUM 10 MG PO TABS
10.0000 mg | ORAL_TABLET | Freq: Every day | ORAL | 0 refills | Status: AC
  Filled 2021-07-03: qty 30, 30d supply, fill #0

## 2021-07-03 MED ORDER — PREDNISONE 50 MG PO TABS
50.0000 mg | ORAL_TABLET | Freq: Every day | ORAL | 0 refills | Status: AC
  Filled 2021-07-03: qty 5, 5d supply, fill #0

## 2021-07-03 NOTE — ED Triage Notes (Signed)
For about 4 days having congestion, cough, sore throat, chills.

## 2021-07-03 NOTE — ED Provider Notes (Signed)
[facility]    CSN: [id number] Arrival date & time: 07/03/21  1434      History   Chief Complaint Chief Complaint  Patient presents with   Cough   Sore Throat   Nasal Congestion    HPI Madison Cantu is a 31 y.o. female.   Pleasant 31 year old female presents today with a 4-day history of postnasal drainage, nasal congestion, and a cough.  She states the drainage is causing her throat to be scratchy.  She states that previous started out being 1 nostril and then switching to the other, but has recently affected both simultaneously.  She states this is making it hard to sleep at night as she is a nose breather.  She has been trying TheraFlu without relief.  She does admit to smoking.  She does have a history of asthma, but has not been bothered by it recently.  She used to use an inhaler but does not have one.  She denies any significant shortness of breath or wheezing.  She states the cough is productive but "is getting stuck in her chest and unable to spit out".  She denies anosmia, ageusia, fever, headache.   Cough Sore Throat   Past Medical History:  Diagnosis Date   Asthma    Hemorrhoids    Hypertension     There are no problems to display for this patient.   History reviewed. No pertinent surgical history.  OB History   No obstetric history on file.      Home Medications    Prior to Admission medications   Medication Sig Start Date End Date Taking? Authorizing Provider  guaiFENesin (MUCINEX) 600 MG 12 hr tablet Take 1 tablet (600 mg total) by mouth 2 (two) times daily as needed for to loosen phlegm or cough. 07/03/21  Yes Jeston Junkins L, PA  montelukast (SINGULAIR) 10 MG tablet Take 1 tablet (10 mg total) by mouth at bedtime. 07/03/21  Yes Kedar Sedano L, PA  predniSONE (DELTASONE) 50 MG tablet Take 1 tablet (50 mg total) by mouth daily with breakfast. 07/03/21  Yes Josiane Labine L, PA  albuterol (VENTOLIN HFA) 108 (90 Base) MCG/ACT inhaler Inhale  2 puffs into the lungs every 6 (six) hours as needed for wheezing or shortness of breath. 01/21/21   Jeannie Fend, PA-C  amLODipine (NORVASC) 10 MG tablet Take 1 tablet (10 mg total) by mouth daily. 07/08/20   Grayce Sessions, NP  hydrochlorothiazide (HYDRODIURIL) 25 MG tablet Take 1 tablet (25 mg total) by mouth daily. 07/08/20   Grayce Sessions, NP  HYDROcodone-acetaminophen (NORCO/VICODIN) 5-325 MG tablet Take 1 tablet by mouth every 4 (four) hours as needed for moderate pain. 12/06/20   Gilda Crease, MD  metFORMIN (GLUCOPHAGE) 500 MG tablet Take 1 tablet (500 mg total) by mouth 3 (three) times daily. 04/27/18 04/26/19  Loletta Specter, PA-C  Norgestimate-Ethinyl Estradiol Triphasic (ORTHO TRI-CYCLEN, 28,) 0.18/0.215/0.25 MG-35 MCG tablet Take 1 tablet by mouth daily. Patient not taking: Reported on 04/27/2018 11/18/17 02/07/19  Loletta Specter, PA-C  omeprazole (PRILOSEC) 40 MG capsule Take 1 capsule (40 mg total) by mouth daily. 04/27/18 04/26/19  Loletta Specter, PA-C    Family History Family History  Problem Relation Age of Onset   Hypertension Mother    Hypertension Father    Hypertension Brother     Social History Social History   Tobacco Use   Smoking status: Former    Types: Cigarettes   Smokeless  tobacco: Current   Tobacco comments:    2 1/2 months   Vaping Use   Vaping Use: Never used  Substance Use Topics   Alcohol use: No   Drug use: Never     Allergies   Citrus, Ciprocinonide [fluocinolone], Clindamycin/lincomycin, Latex, Penicillins, Shellfish allergy, and Aspirin   Review of Systems Review of Systems  HENT:  Positive for congestion and sinus pressure.   Respiratory:  Positive for cough.   All other systems reviewed and are negative.   Physical Exam Triage Vital Signs ED Triage Vitals  Enc Vitals Group     BP 07/03/21 1604 (!) 135/96     Pulse Rate 07/03/21 1604 69     Resp 07/03/21 1604 18     Temp 07/03/21 1604 98 F (36.7 C)      Temp Source 07/03/21 1604 Oral     SpO2 07/03/21 1604 100 %     Weight --      Height --      Head Circumference --      Peak Flow --      Pain Score 07/03/21 1603 8     Pain Loc --      Pain Edu? --      Excl. in GC? --    No data found.  Updated Vital Signs BP (!) 135/96 (BP Location: Left Arm)    Pulse 69    Temp 98 F (36.7 C) (Oral)    Resp 18    LMP 07/03/2021    SpO2 100%   Visual Acuity Right Eye Distance:   Left Eye Distance:   Bilateral Distance:    Right Eye Near:   Left Eye Near:    Bilateral Near:     Physical Exam Vitals and nursing note reviewed.  Constitutional:      General: She is not in acute distress.    Appearance: She is well-developed. She is obese. She is not ill-appearing, toxic-appearing or diaphoretic.  HENT:     Head: Normocephalic and atraumatic.     Right Ear: Tympanic membrane, ear canal and external ear normal. No drainage, swelling or tenderness. No middle ear effusion. There is no impacted cerumen. Tympanic membrane is not erythematous.     Left Ear: Tympanic membrane, ear canal and external ear normal. No drainage, swelling or tenderness.  No middle ear effusion. There is no impacted cerumen. Tympanic membrane is not erythematous.     Nose: Nose normal. No congestion or rhinorrhea.     Mouth/Throat:     Mouth: Mucous membranes are moist. No oral lesions.     Pharynx: Oropharynx is clear. No pharyngeal swelling, oropharyngeal exudate, posterior oropharyngeal erythema or uvula swelling.     Tonsils: No tonsillar exudate or tonsillar abscesses.  Eyes:     General: No scleral icterus.       Right eye: No discharge.        Left eye: No discharge.     Extraocular Movements: Extraocular movements intact.     Right eye: Normal extraocular motion.     Left eye: Normal extraocular motion.     Conjunctiva/sclera: Conjunctivae normal.     Pupils: Pupils are equal, round, and reactive to light.  Cardiovascular:     Rate and Rhythm: Normal  rate and regular rhythm.     Heart sounds: No murmur heard. Pulmonary:     Effort: Pulmonary effort is normal. No accessory muscle usage, respiratory distress or retractions.     Breath sounds:  Normal breath sounds and air entry. No stridor, decreased air movement or transmitted upper airway sounds. No decreased breath sounds, wheezing, rhonchi or rales.  Chest:     Chest wall: No tenderness.  Abdominal:     Palpations: Abdomen is soft.     Tenderness: There is no abdominal tenderness.  Musculoskeletal:        General: No swelling.     Cervical back: Normal range of motion and neck supple. No rigidity or tenderness.  Lymphadenopathy:     Cervical: No cervical adenopathy.  Skin:    General: Skin is warm and dry.     Capillary Refill: Capillary refill takes less than 2 seconds.  Neurological:     Mental Status: She is alert.  Psychiatric:        Mood and Affect: Mood normal.     UC Treatments / Results  Labs (all labs ordered are listed, but only abnormal results are displayed) Labs Reviewed - No data to display  EKG   Radiology No results found.  Procedures Procedures (including critical care time)  Medications Ordered in UC Medications - No data to display  Initial Impression / Assessment and Plan / UC Course  I have reviewed the triage vital signs and the nursing notes.  Pertinent labs & imaging results that were available during my care of the patient were reviewed by me and considered in my medical decision making (see chart for details).     Viral URI - given hx of allergies and asthma, will tx supportively with montelukast at night and prednisone in morning. Pt does have fluocinolone listed as an allergy, but she tells me this was an adverse reaction to the steroid ear drops as it caused dryness. She has taken and tolerated prednisone orally countless times in the past.   Final Clinical Impressions(s) / UC Diagnoses   Final diagnoses:  Viral URI with cough      Discharge Instructions      Stop taking theraflu. Start taking plain Mucinex.  Take with plenty of water to thin the nasal secretions Purchase over-the-counter saline spray, probably in an aerosol can to cleanse the sinus passages. Take montelukast at night and prednisone in the morning. Follow-up with your primary care physician should symptoms persist over the next week.     ED Prescriptions     Medication Sig Dispense Auth. Provider   montelukast (SINGULAIR) 10 MG tablet Take 1 tablet (10 mg total) by mouth at bedtime. 30 tablet Yoselin Amerman L, PA   predniSONE (DELTASONE) 50 MG tablet Take 1 tablet (50 mg total) by mouth daily with breakfast. 5 tablet Jacody Beneke L, PA   guaiFENesin (MUCINEX) 600 MG 12 hr tablet Take 1 tablet (600 mg total) by mouth 2 (two) times daily as needed for to loosen phlegm or cough. 20 tablet Grainne Knights L, [place]      PDMP not reviewed this encounter.   Maretta Bees, [place] 07/03/21 1646

## 2021-07-03 NOTE — Discharge Instructions (Addendum)
Stop taking theraflu. Start taking plain Mucinex.  Take with plenty of water to thin the nasal secretions Purchase over-the-counter saline spray, probably in an aerosol can to cleanse the sinus passages. Take montelukast at night and prednisone in the morning. Follow-up with your primary care physician should symptoms persist over the next week.

## 2021-07-10 ENCOUNTER — Encounter (INDEPENDENT_AMBULATORY_CARE_PROVIDER_SITE_OTHER): Payer: Self-pay | Admitting: Primary Care

## 2021-07-11 ENCOUNTER — Other Ambulatory Visit (HOSPITAL_COMMUNITY): Payer: Self-pay

## 2021-07-15 ENCOUNTER — Encounter (INDEPENDENT_AMBULATORY_CARE_PROVIDER_SITE_OTHER): Payer: Self-pay

## 2021-07-15 ENCOUNTER — Encounter (INDEPENDENT_AMBULATORY_CARE_PROVIDER_SITE_OTHER): Payer: Self-pay | Admitting: Primary Care

## 2021-08-11 ENCOUNTER — Emergency Department (HOSPITAL_COMMUNITY): Admission: EM | Admit: 2021-08-11 | Discharge: 2021-08-11 | Discharge status: Against Medical Advice | Payer: Self-pay

## 2021-08-11 NOTE — ED Notes (Signed)
Pt has left.  ?

## 2021-08-23 ENCOUNTER — Encounter (HOSPITAL_COMMUNITY): Payer: Self-pay

## 2021-08-23 ENCOUNTER — Ambulatory Visit (HOSPITAL_COMMUNITY)
Admission: EM | Admit: 2021-08-23 | Discharge: 2021-08-23 | Disposition: A | Discharge status: Home / Self Care | Payer: Self-pay | Attending: Nurse Practitioner | Admitting: Nurse Practitioner

## 2021-08-23 DIAGNOSIS — H9201 Otalgia, right ear: Secondary | ICD-10-CM

## 2021-08-23 DIAGNOSIS — H6121 Impacted cerumen, right ear: Secondary | ICD-10-CM

## 2021-08-23 DIAGNOSIS — H6981 Other specified disorders of Eustachian tube, right ear: Secondary | ICD-10-CM

## 2021-08-23 DIAGNOSIS — H65191 Other acute nonsuppurative otitis media, right ear: Secondary | ICD-10-CM

## 2021-08-23 MED ORDER — FLUTICASONE PROPIONATE 50 MCG/ACT NA SUSP: 2.0000 | Freq: Every day | NASAL | 0 refills | Status: AC

## 2021-08-23 NOTE — ED Triage Notes (Signed)
Pt presents with right ear pain and ringing X 1 week.  ?

## 2021-08-23 NOTE — Discharge Instructions (Addendum)
Take medication as prescribed. ?Warm compresses to the affected ear for discomfort. ?May take ibuprofen or Tylenol for pain, fever, or general discomfort. ?Follow-up if symptoms do not improve, you may need an oral steroid at that time. ? ?

## 2021-08-23 NOTE — ED Provider Notes (Signed)
?[facility] ? ? ? ?CSN: [id number] ?Arrival date & time: 08/23/21  1454 ? ? ?  ? ?History   ?Chief Complaint ?Chief Complaint  ?Patient presents with  ? Otalgia  ? ? ?HPI ?Joclyn A Gavina is a 31 y.o. female.  ? ?The patient is a 31 year old female who presents with right ear pain.  Patient states symptoms started approximately 1 week ago.  She states that she is having difficulty hearing out of the left ear, sounds like she hears the echo, and has a headache.  She also admits to intermittent episodes of dizziness.  She states that she has a history of wax buildup in the right ear.  Denies any symptoms on the left side, fever, chills, headache, cough.  Patient does state that she has been having symptoms with her sinuses over the past week to include nasal congestion, sinus pressure and runny nose.  States that the last time she had this issue was approximately 1 year ago.  States she has been taking Excedrin for her symptoms. She also takes Allegra for her allergies. ? ? ?Otalgia ?Location:  Right ?Progression:  Waxing and waning ?Context: not recent URI   ?Relieved by:  OTC medications ?Associated symptoms: headaches   ?Associated symptoms: no congestion, no cough, no ear discharge, no fever, no rhinorrhea and no sore throat   ? ?Past Medical History:  ?Diagnosis Date  ? Asthma   ? Hemorrhoids   ? Hypertension   ? ? ?There are no problems to display for this patient. ? ? ?History reviewed. No pertinent surgical history. ? ?OB History   ?No obstetric history on file. ?  ? ? ? ?Home Medications   ? ?Prior to Admission medications   ?Medication Sig Start Date End Date Taking? Authorizing Provider  ?albuterol (VENTOLIN HFA) 108 (90 Base) MCG/ACT inhaler Inhale 2 puffs into the lungs every 6 (six) hours as needed for wheezing or shortness of breath. 01/21/21   Jeannie Fend, PA-C  ?amLODipine (NORVASC) 10 MG tablet Take 1 tablet (10 mg total) by mouth daily. 07/08/20   Grayce Sessions, NP  ?guaiFENesin  (MUCINEX) 600 MG 12 hr tablet Take 1 tablet (600 mg total) by mouth 2 (two) times daily as needed to loosen phlegm or cough. 07/03/21   Crain, Whitney L, PA  ?hydrochlorothiazide (HYDRODIURIL) 25 MG tablet Take 1 tablet (25 mg total) by mouth daily. 07/08/20   Grayce Sessions, NP  ?HYDROcodone-acetaminophen (NORCO/VICODIN) 5-325 MG tablet Take 1 tablet by mouth every 4 (four) hours as needed for moderate pain. 12/06/20   Gilda Crease, MD  ?montelukast (SINGULAIR) 10 MG tablet Take 1 tablet (10 mg total) by mouth at bedtime. 07/03/21   Guy Sandifer L, PA  ?predniSONE (DELTASONE) 50 MG tablet Take 1 tablet (50 mg total) by mouth daily with breakfast. 07/03/21   Crain, Whitney L, PA  ?metFORMIN (GLUCOPHAGE) 500 MG tablet Take 1 tablet (500 mg total) by mouth 3 (three) times daily. 04/27/18 04/26/19  Loletta Specter, PA-C  ?Norgestimate-Ethinyl Estradiol Triphasic (ORTHO TRI-CYCLEN, 28,) 0.18/0.215/0.25 MG-35 MCG tablet Take 1 tablet by mouth daily. ?Patient not taking: Reported on 04/27/2018 11/18/17 02/07/19  Loletta Specter, PA-C  ?omeprazole (PRILOSEC) 40 MG capsule Take 1 capsule (40 mg total) by mouth daily. 04/27/18 04/26/19  Loletta Specter, PA-C  ? ? ?Family History ?Family History  ?Problem Relation Age of Onset  ? Hypertension Mother   ? Hypertension Father   ? Hypertension Brother   ? ? ?  Social History ?Social History  ? ?Tobacco Use  ? Smoking status: Former  ?  Types: Cigarettes  ? Smokeless tobacco: Current  ? Tobacco comments:  ?  2 1/2 months   ?Vaping Use  ? Vaping Use: Never used  ?Substance Use Topics  ? Alcohol use: No  ? Drug use: Never  ? ? ? ?Allergies   ?Citrus, Ciprocinonide [fluocinolone], Clindamycin/lincomycin, Latex, Penicillins, Shellfish allergy, and Aspirin ? ? ?Review of Systems ?Review of Systems  ?Constitutional: Negative.  Negative for fever.  ?HENT:  Positive for ear pain. Negative for congestion, ear discharge, rhinorrhea and sore throat.   ?Respiratory: Negative.   Negative for cough.   ?Cardiovascular: Negative.   ?Skin: Negative.   ?Neurological:  Positive for headaches.  ?Psychiatric/Behavioral: Negative.    ? ? ?Physical Exam ?Triage Vital Signs ?ED Triage Vitals  ?Enc Vitals Group  ?   BP 08/23/21 1617 128/90  ?   Pulse Rate 08/23/21 1617 76  ?   Resp 08/23/21 1617 18  ?   Temp 08/23/21 1617 97.9 ?F (36.6 ?C)  ?   Temp Source 08/23/21 1617 Oral  ?   SpO2 08/23/21 1617 98 %  ?   Weight --   ?   Height --   ?   Head Circumference --   ?   Peak Flow --   ?   Pain Score 08/23/21 1620 5  ?   Pain Loc --   ?   Pain Edu? --   ?   Excl. in GC? --   ? ?No data found. ? ?Updated Vital Signs ?BP 128/90 (BP Location: Right Arm)   Pulse 76   Temp 97.9 ?F (36.6 ?C) (Oral)   Resp 18   LMP 07/25/2021   SpO2 98%  ? ?Visual Acuity ?Right Eye Distance:   ?Left Eye Distance:   ?Bilateral Distance:   ? ?Right Eye Near:   ?Left Eye Near:    ?Bilateral Near:    ? ?Physical Exam ?Vitals reviewed.  ?Constitutional:   ?   General: She is not in acute distress. ?   Appearance: Normal appearance.  ?HENT:  ?   Head: Normocephalic and atraumatic.  ?   Right Ear: Ear canal and external ear normal. There is impacted cerumen.  ?   Left Ear: Tympanic membrane, ear canal and external ear normal. There is no impacted cerumen.  ?   Ears:  ?   Comments: Ear irrigation performed to the right ear. Able to visualize TM. No bulging or erythema of the right TM. + middle ear effusion present.  ?   Nose: Nose normal.  ?   Mouth/Throat:  ?   Mouth: Mucous membranes are moist.  ?Eyes:  ?   Extraocular Movements: Extraocular movements intact.  ?   Conjunctiva/sclera: Conjunctivae normal.  ?   Pupils: Pupils are equal, round, and reactive to light.  ?Cardiovascular:  ?   Rate and Rhythm: Normal rate and regular rhythm.  ?   Pulses: Normal pulses.  ?   Heart sounds: Normal heart sounds.  ?Pulmonary:  ?   Effort: Pulmonary effort is normal.  ?   Breath sounds: Normal breath sounds.  ?Abdominal:  ?   General: Bowel  sounds are normal.  ?   Palpations: Abdomen is soft.  ?Musculoskeletal:  ?   Cervical back: Normal range of motion and neck supple.  ?Skin: ?   General: Skin is warm and dry.  ?   Capillary Refill: Capillary  refill takes less than 2 seconds.  ?Neurological:  ?   General: No focal deficit present.  ?   Mental Status: She is alert and oriented to person, place, and time.  ?Psychiatric:     ?   Mood and Affect: Mood normal.     ?   Behavior: Behavior normal.  ? ? ? ?UC Treatments / Results  ?Labs ?(all labs ordered are listed, but only abnormal results are displayed) ?Labs Reviewed - No data to display ? ?EKG ? ? ?Radiology ?No results found. ? ?Procedures ?Procedures (including critical care time) ? ?Medications Ordered in UC ?Medications - No data to display ? ?Initial Impression / Assessment and Plan / UC Course  ?I have reviewed the triage vital signs and the nursing notes. ? ?Pertinent labs & imaging results that were available during my care of the patient were reviewed by me and considered in my medical decision making (see chart for details). ? ?The patient is a 31 year old female who presents with right ear pain for 1 week.  History of seasonal allergies, and states that her sinus symptoms have flared over the past week.  Ear irrigation was performed, which patient tolerated well.  Was able to visualize the TM afterwards.  Patient with fluid behind the right TM, which would be consistent with her previous allergy symptoms.  Will prescribe the patient fluticasone to help with the swelling of her eustachian tubes.  No concern for an otitis media based on her exam, vital signs are stable.  Patient advised to also continue using her Allegra that she is currently taking while she has symptoms.  Warm compresses to the affected ear, may use ibuprofen as needed for pain, fever, or general discomfort. ?Follow-up as needed. ?Final Clinical Impressions(s) / UC Diagnoses  ? ?Final diagnoses:  ?None  ? ?Discharge  Instructions   ?None ?  ? ?ED Prescriptions   ?None ?  ? ?PDMP not reviewed this encounter. ?  ?Abran CantorLeath-Warren, Kalik Hoare J, NP ?08/23/21 1815 ? ?

## 2021-10-02 ENCOUNTER — Other Ambulatory Visit: Payer: Self-pay

## 2021-10-02 ENCOUNTER — Encounter (HOSPITAL_COMMUNITY): Payer: Self-pay | Admitting: *Deleted

## 2021-10-02 ENCOUNTER — Emergency Department (HOSPITAL_COMMUNITY): Payer: Self-pay

## 2021-10-02 ENCOUNTER — Emergency Department (HOSPITAL_COMMUNITY)
Admission: EM | Admit: 2021-10-02 | Discharge: 2021-10-02 | Disposition: A | Discharge status: Home / Self Care | Payer: Self-pay | Attending: Emergency Medicine | Admitting: Emergency Medicine

## 2021-10-02 ENCOUNTER — Other Ambulatory Visit (HOSPITAL_COMMUNITY): Payer: Self-pay

## 2021-10-02 DIAGNOSIS — S060X0A Concussion without loss of consciousness, initial encounter: Secondary | ICD-10-CM | POA: Insufficient documentation

## 2021-10-02 DIAGNOSIS — I10 Essential (primary) hypertension: Secondary | ICD-10-CM | POA: Insufficient documentation

## 2021-10-02 DIAGNOSIS — Z79899 Other long term (current) drug therapy: Secondary | ICD-10-CM | POA: Insufficient documentation

## 2021-10-02 DIAGNOSIS — Z7951 Long term (current) use of inhaled steroids: Secondary | ICD-10-CM | POA: Insufficient documentation

## 2021-10-02 DIAGNOSIS — J45909 Unspecified asthma, uncomplicated: Secondary | ICD-10-CM | POA: Insufficient documentation

## 2021-10-02 DIAGNOSIS — W01198A Fall on same level from slipping, tripping and stumbling with subsequent striking against other object, initial encounter: Secondary | ICD-10-CM | POA: Insufficient documentation

## 2021-10-02 DIAGNOSIS — Z91040 Latex allergy status: Secondary | ICD-10-CM | POA: Insufficient documentation

## 2021-10-02 LAB — POC URINE PREG, ED: Preg Test, Ur: NEGATIVE

## 2021-10-02 MED ORDER — ONDANSETRON 4 MG PO TBDP
4.0000 mg | ORAL_TABLET | Freq: Three times a day (TID) | ORAL | 0 refills | Status: AC | PRN
  Filled 2021-10-02: qty 20, 7d supply, fill #0

## 2021-10-02 NOTE — ED Provider Notes (Signed)
?[facility] EMERGENCY DEPARTMENT ?Provider Note ? ? ?CSN: [id number] ?Arrival date & time: 10/02/21  1101 ? ?  ? ?History ? ?Chief Complaint  ?Patient presents with  ? Headache  ? ? ?Leyli A Blatchford is a 31 y.o. female. ? ?31 year old female presents today for evaluation of headache that has been ongoing now for several days.  Patient states she tripped over her kids toys and fell forward hitting her head on a windowsill.  She states initially she had a hematoma which has resolved.  She denies any visual changes.  She endorses some nausea however denies vomiting.  She endorses photophobia.  Denies other complaints.  She did not lose consciousness.  Does not take anticoagulation. ? ?The history is provided by the patient. No language interpreter was used.  ? ?  ? ?Home Medications ?Prior to Admission medications   ?Medication Sig Start Date End Date Taking? Authorizing Provider  ?albuterol (VENTOLIN HFA) 108 (90 Base) MCG/ACT inhaler Inhale 2 puffs into the lungs every 6 (six) hours as needed for wheezing or shortness of breath. 01/21/21   Jeannie Fend, PA-C  ?amLODipine (NORVASC) 10 MG tablet Take 1 tablet (10 mg total) by mouth daily. 07/08/20   Grayce Sessions, NP  ?fluticasone (FLONASE) 50 MCG/ACT nasal spray Place 2 sprays into both nostrils daily for 14 days. 08/23/21 09/06/21  Leath-Warren, Sadie Haber, NP  ?guaiFENesin (MUCINEX) 600 MG 12 hr tablet Take 1 tablet (600 mg total) by mouth 2 (two) times daily as needed to loosen phlegm or cough. 07/03/21   Crain, Whitney L, PA  ?hydrochlorothiazide (HYDRODIURIL) 25 MG tablet Take 1 tablet (25 mg total) by mouth daily. 07/08/20   Grayce Sessions, NP  ?HYDROcodone-acetaminophen (NORCO/VICODIN) 5-325 MG tablet Take 1 tablet by mouth every 4 (four) hours as needed for moderate pain. 12/06/20   Gilda Crease, MD  ?montelukast (SINGULAIR) 10 MG tablet Take 1 tablet (10 mg total) by mouth at bedtime. 07/03/21   Guy Sandifer L, PA  ?predniSONE  (DELTASONE) 50 MG tablet Take 1 tablet (50 mg total) by mouth daily with breakfast. 07/03/21   Crain, Whitney L, PA  ?metFORMIN (GLUCOPHAGE) 500 MG tablet Take 1 tablet (500 mg total) by mouth 3 (three) times daily. 04/27/18 04/26/19  Loletta Specter, PA-C  ?Norgestimate-Ethinyl Estradiol Triphasic (ORTHO TRI-CYCLEN, 28,) 0.18/0.215/0.25 MG-35 MCG tablet Take 1 tablet by mouth daily. ?Patient not taking: Reported on 04/27/2018 11/18/17 02/07/19  Loletta Specter, PA-C  ?omeprazole (PRILOSEC) 40 MG capsule Take 1 capsule (40 mg total) by mouth daily. 04/27/18 04/26/19  Loletta Specter, PA-C  ?   ? ?Allergies    ?Citrus, Ciprocinonide [fluocinolone], Clindamycin/lincomycin, Latex, Penicillins, Shellfish allergy, and Aspirin   ? ?Review of Systems   ?Review of Systems  ?Constitutional:  Negative for chills and fever.  ?Eyes:  Positive for photophobia. Negative for visual disturbance.  ?Gastrointestinal:  Positive for nausea. Negative for vomiting.  ?Neurological:  Positive for headaches. Negative for syncope, weakness and light-headedness.  ?All other systems reviewed and are negative. ? ?Physical Exam ?Updated Vital Signs ?BP (!) 140/94 (BP Location: Right Arm)   Pulse 86   Temp 98.3 ?F (36.8 ?C) (Oral)   Resp 14   LMP 09/27/2021   SpO2 99%  ?Physical Exam ?Vitals and nursing note reviewed.  ?Constitutional:   ?   General: She is not in acute distress. ?   Appearance: Normal appearance. She is not ill-appearing.  ?HENT:  ?   Head:  Normocephalic and atraumatic. No raccoon eyes, Battle's sign or masses.  ?   Nose: Nose normal.  ?Eyes:  ?   General: No scleral icterus. ?   Extraocular Movements: Extraocular movements intact.  ?   Conjunctiva/sclera: Conjunctivae normal.  ?   Pupils: Pupils are equal, round, and reactive to light.  ?Cardiovascular:  ?   Rate and Rhythm: Normal rate and regular rhythm.  ?   Pulses: Normal pulses.  ?   Heart sounds: Normal heart sounds.  ?Pulmonary:  ?   Effort: Pulmonary effort is  normal. No respiratory distress.  ?   Breath sounds: Normal breath sounds. No wheezing or rales.  ?Abdominal:  ?   General: There is no distension.  ?   Tenderness: There is no abdominal tenderness.  ?Musculoskeletal:     ?   General: Normal range of motion.  ?   Cervical back: Normal range of motion.  ?Skin: ?   General: Skin is warm and dry.  ?Neurological:  ?   General: No focal deficit present.  ?   Mental Status: She is alert and oriented to person, place, and time. Mental status is at baseline.  ?   GCS: GCS eye subscore is 4. GCS verbal subscore is 5. GCS motor subscore is 6.  ?   Cranial Nerves: No cranial nerve deficit or facial asymmetry.  ?   Sensory: Sensation is intact.  ?   Motor: No pronator drift.  ?   Comments: Cranial nerves III through XII intact.  Pupils equal round reactive to light.  Full range of motion in bilateral upper and lower extremities and 5/5 strength in extensor and flexor muscle groups.  Sensation intact and symmetrical.  ? ? ?ED Results / Procedures / Treatments   ?Labs ?(all labs ordered are listed, but only abnormal results are displayed) ?Labs Reviewed  ?POC URINE PREG, ED  ? ? ?EKG ?None ? ?Radiology ?CT Head Wo Contrast ? ?Result Date: 10/02/2021 ?CLINICAL DATA:  Head trauma. Fall, striking the left forehead against a door sill 1 week ago. Persistent headache. EXAM: CT HEAD WITHOUT CONTRAST TECHNIQUE: Contiguous axial images were obtained from the base of the skull through the vertex without intravenous contrast. RADIATION DOSE REDUCTION: This exam was performed according to the departmental dose-optimization program which includes automated exposure control, adjustment of the mA and/or kV according to patient size and/or use of iterative reconstruction technique. COMPARISON:  None Available. FINDINGS: Brain: There is no evidence of an acute infarct, intracranial hemorrhage, mass, midline shift, or extra-axial fluid collection. The ventricles and sulci are normal. Vascular: No  hyperdense vessel. Skull: No acute fracture or suspicious osseous lesion. Sinuses/Orbits: Minimal right ethmoid air cell mucosal thickening. Clear mastoid air cells. Unremarkable orbits. Other: None. IMPRESSION: Negative head CT. Electronically Signed   By: Sebastian Ache M.D.   On: 10/02/2021 12:20   ? ?Procedures ?Procedures  ? ? ?Medications Ordered in ED ?Medications - No data to display ? ?ED Course/ Medical Decision Making/ A&P ?  ?                        ?Medical Decision Making ? ?Medical Decision Making / ED Course ? ? ?This patient presents to the ED for concern of headache following fall, this involves an extensive number of treatment options, and is a complaint that carries with it a high risk of complications and morbidity.  The differential diagnosis includes concussion, ICH, posttraumatic headache ? ?MDM: ?31 year old  female presents today for evaluation of left-sided headache following fall that occurred several days ago while tripping over her kids toys.  History or exam without red flag signs or symptoms.  CT head without acute intracranial findings.  Exam without focal neurological deficits.  Patient most likely has concussion.  Will provide concussion information sheet.  Discussed signs or symptoms concerning that warrant emergency room follow-up.  Otherwise will provide concussion clinic information to follow-up with for persistent symptoms.  She also has a PCP she can follow-up with.  Will provide Zofran for any nausea.  Denies any vomiting.  Patient voices understanding and is in agreement with the plan. ? ? ?Lab Tests: ?-I ordered, reviewed, and interpreted labs.   ?The pertinent results include:   ?Labs Reviewed  ?POC URINE PREG, ED  ?  ? ? ?EKG ? EKG Interpretation ? ?Date/Time:    ?Ventricular Rate:    ?PR Interval:    ?QRS Duration:   ?QT Interval:    ?QTC Calculation:   ?R Axis:     ?Text Interpretation:   ?  ? ?  ? ? ? ?Imaging Studies ordered: ?I ordered imaging studies including CT  head ?I independently visualized and interpreted imaging. ?I agree with the radiologist interpretation ? ? ?Medicines ordered and prescription drug management: ?No orders of the defined types were placed in this

## 2021-10-02 NOTE — ED Triage Notes (Signed)
Pt reports hitting left side of her head on object 1 week ago. No loc. Having headaches and nausea since.  ?

## 2021-10-02 NOTE — ED Provider Triage Note (Signed)
Emergency Medicine Provider Triage Evaluation Note ? ?Madison Cantu , a 31 y.o. female  was evaluated in triage.  Pt complains of head injury. Report she fell and struck her L forehead against a door sill 1 week ago.  Report persistent headache since.  No nausea/vomiting, confusion, loss of vision ? ?Review of Systems  ?Positive: As above ?Negative: As above ? ?Physical Exam  ?BP (!) 140/94 (BP Location: Right Arm)   Pulse 86   Temp 98.3 ?F (36.8 ?C) (Oral)   Resp 14   LMP 09/27/2021   SpO2 99%  ?Gen:   Awake, no distress   ?Resp:  Normal effort  ?MSK:   Moves extremities without difficulty  ?Other:  Ttp L temporal region ? ?Medical Decision Making  ?Medically screening exam initiated at 11:25 AM.  Appropriate orders placed.  Madison Cantu was informed that the remainder of the evaluation will be completed by another provider, this initial triage assessment does not replace that evaluation, and the importance of remaining in the ED until their evaluation is complete. ? ? ?  ?Fayrene Helper, PA-C ?10/02/21 1127 ? ?

## 2021-10-02 NOTE — Discharge Instructions (Addendum)
Your exam today was reassuring.  I have sent Zofran into the pharmacy for you to use for nausea.  If you have any worsening symptoms please return to the emergency room.  Otherwise you can follow-up with your primary care with the concussion clinic. ?

## 2021-10-10 ENCOUNTER — Other Ambulatory Visit (HOSPITAL_COMMUNITY): Payer: Self-pay

## 2021-11-19 ENCOUNTER — Emergency Department (HOSPITAL_COMMUNITY)
Admission: EM | Admit: 2021-11-19 | Discharge: 2021-11-19 | Disposition: A | Discharge status: Home / Self Care | Payer: Self-pay | Attending: Emergency Medicine | Admitting: Emergency Medicine

## 2021-11-19 ENCOUNTER — Other Ambulatory Visit: Payer: Self-pay

## 2021-11-19 DIAGNOSIS — Z91040 Latex allergy status: Secondary | ICD-10-CM | POA: Insufficient documentation

## 2021-11-19 DIAGNOSIS — K0889 Other specified disorders of teeth and supporting structures: Secondary | ICD-10-CM | POA: Insufficient documentation

## 2021-11-19 MED ORDER — KETOROLAC TROMETHAMINE 15 MG/ML IJ SOLN
15.0000 mg | Freq: Once | INTRAMUSCULAR | Status: AC
  Administered 2021-11-19: 15 mg via INTRAMUSCULAR
  Filled 2021-11-19: qty 1

## 2021-11-19 MED ORDER — HYDROCODONE-ACETAMINOPHEN 5-325 MG PO TABS
1.0000 | ORAL_TABLET | Freq: Once | ORAL | Status: AC
  Administered 2021-11-19: 1 via ORAL
  Filled 2021-11-19: qty 1

## 2021-11-19 MED ORDER — CEPHALEXIN 500 MG PO CAPS: 500.0000 mg | ORAL_CAPSULE | Freq: Three times a day (TID) | ORAL | 0 refills | Status: AC

## 2021-11-19 NOTE — ED Provider Notes (Signed)
[facility]-EMERGENCY DEPT Provider Note   CSN: [id number] Arrival date & time: 11/19/21  1946     History  Chief Complaint  Patient presents with   Dental Pain    Madison Cantu is a 31 y.o. female.  31 year old female presents today for evaluation of dental pain onset today.  Located in the left lower mouth.  Denies fever, chills, difficulty swallowing, or other complaints.  She states in [place] she had wisdom teeth taken out and her tooth next to one of the molars on the left side was chipped.  She states she had a cavity put in but is worried that this may be requiring a refill soon.  Denies difficulty swallowing, neck pain, neck swelling, or fever.  The history is provided by the patient. No language interpreter was used.       Home Medications Prior to Admission medications   Medication Sig Start Date End Date Taking? Authorizing Provider  albuterol (VENTOLIN HFA) 108 (90 Base) MCG/ACT inhaler Inhale 2 puffs into the lungs every 6 (six) hours as needed for wheezing or shortness of breath. 01/21/21   Jeannie Fend, PA-C  amLODipine (NORVASC) 10 MG tablet Take 1 tablet (10 mg total) by mouth daily. 07/08/20   Grayce Sessions, NP  fluticasone (FLONASE) 50 MCG/ACT nasal spray Place 2 sprays into both nostrils daily for 14 days. 08/23/21 09/06/21  Leath-Warren, Sadie Haber, NP  guaiFENesin (MUCINEX) 600 MG 12 hr tablet Take 1 tablet (600 mg total) by mouth 2 (two) times daily as needed to loosen phlegm or cough. 07/03/21   Crain, Whitney L, PA  hydrochlorothiazide (HYDRODIURIL) 25 MG tablet Take 1 tablet (25 mg total) by mouth daily. 07/08/20   Grayce Sessions, NP  HYDROcodone-acetaminophen (NORCO/VICODIN) 5-325 MG tablet Take 1 tablet by mouth every 4 (four) hours as needed for moderate pain. 12/06/20   Gilda Crease, MD  montelukast (SINGULAIR) 10 MG tablet Take 1 tablet (10 mg total) by mouth at bedtime. 07/03/21   Crain, Whitney L, PA  ondansetron  (ZOFRAN-ODT) 4 MG disintegrating tablet Take 1 tablet (4 mg total) by mouth every 8 (eight) hours as needed for nausea or vomiting. 10/02/21   Marita [place], PA-C  predniSONE (DELTASONE) 50 MG tablet Take 1 tablet (50 mg total) by mouth daily with breakfast. 07/03/21   Crain, Whitney L, PA  metFORMIN (GLUCOPHAGE) 500 MG tablet Take 1 tablet (500 mg total) by mouth 3 (three) times daily. 04/27/18 04/26/19  Loletta Specter, PA-C  Norgestimate-Ethinyl Estradiol Triphasic (ORTHO TRI-CYCLEN, 28,) 0.18/0.215/0.25 MG-35 MCG tablet Take 1 tablet by mouth daily. Patient not taking: Reported on 04/27/2018 11/18/17 02/07/19  Loletta Specter, PA-C  omeprazole (PRILOSEC) 40 MG capsule Take 1 capsule (40 mg total) by mouth daily. 04/27/18 04/26/19  Loletta Specter, PA-C      Allergies    Citrus, Ciprocinonide [fluocinolone], Clindamycin/lincomycin, Latex, Penicillins, Shellfish allergy, and Aspirin    Review of Systems   Review of Systems  Constitutional:  Negative for fever.  HENT:  Positive for dental problem. Negative for sore throat.   Neurological:  Negative for headaches.  All other systems reviewed and are negative.   Physical Exam Updated Vital Signs BP 136/86 (BP Location: Left Arm)   Pulse 69   Temp 98.4 F (36.9 C) (Oral)   Resp 18   Ht 5\' 6"  (1.676 m)   Wt (!) 152 kg   SpO2 100%   BMI 54.07 kg/m  Physical Exam  Vitals and nursing note reviewed.  Constitutional:      General: She is not in acute distress.    Appearance: Normal appearance. She is not ill-appearing.  HENT:     Head: Normocephalic and atraumatic.     Nose: Nose normal.     Mouth/Throat:     Comments: Mouth without swelling.  No apparent abscess noted.  No concern for peritonsillar abscess, retropharyngeal abscess, Ludwick's angina.  Without trismus.  Without neck swelling. Eyes:     Conjunctiva/sclera: Conjunctivae normal.  Cardiovascular:     Rate and Rhythm: Normal rate and regular rhythm.  Pulmonary:     Effort:  Pulmonary effort is normal. No respiratory distress.  Musculoskeletal:        General: No deformity.  Skin:    Findings: No rash.  Neurological:     Mental Status: She is alert.     ED Results / Procedures / Treatments   Labs (all labs ordered are listed, but only abnormal results are displayed) Labs Reviewed - No data to display  EKG None  Radiology No results found.  Procedures Procedures    Medications Ordered in ED Medications - No data to display  ED Course/ Medical Decision Making/ A&P                           Medical Decision Making  Patient presents for dental pain.  No apparent swelling noted.  Without neck swelling, difficulty swallowing, trismus.  No evidence of Ludwick's angina, retropharyngeal abscess, peritonsillar abscess.  Uvula is midline.  Will prescribe Keflex given she is allergic to penicillins and clindamycin.  Dental resources given.  Given dose of Toradol, and pain medication in the emergency room.  Discussed use of Tylenol and Motrin for pain relief at home.  She voices understanding and is in agreement with plan.  Strict return precautions discussed.   Final Clinical Impression(s) / ED Diagnoses Final diagnoses:  Pain, dental    Rx / DC Orders ED Discharge Orders          Ordered    cephALEXin (KEFLEX) 500 MG capsule  3 times daily        11/19/21 2219              Marita [place], PA-C 11/19/21 2228    Sloan Leiter, DO 11/22/21 [phone]

## 2021-11-19 NOTE — Discharge Instructions (Addendum)
Your exam today was overall reassuring.  I have attached dental resource for you above.  Please call and schedule an appointment.  Take Tylenol and Motrin as you need to for pain relief.

## 2021-11-19 NOTE — ED Triage Notes (Signed)
Patient coming to ED for evaluation of dental pain.  Reports "when I had my wisdom tooth taken out they chipped my tooth.  I had a temporary filling placed and now it is causing a lot of pain."  Unable to relieve pain with OTC medications

## 2021-11-19 NOTE — ED Provider Triage Note (Signed)
Emergency Medicine Provider Triage Evaluation Note  Conrad [place] , a 31 y.o. female  was evaluated in triage.  Pt complains of left lower dental pain.  Onset today..  Review of Systems  Positive: As above Negative: As above  Physical Exam  BP 136/86 (BP Location: Left Arm)   Pulse 69   Temp 98.4 F (36.9 C) (Oral)   Resp 18   Ht 5\' 6"  (1.676 m)   Wt (!) 152 kg   SpO2 100%   BMI 54.07 kg/m  Gen:   Awake, no distress   Resp:  Normal effort  MSK:   Moves extremities without difficulty  Other:    Medical Decision Making  Medically screening exam initiated at 8:48 PM.  Appropriate orders placed.  JOVANKA WESTGATE was informed that the remainder of the evaluation will be completed by another provider, this initial triage assessment does not replace that evaluation, and the importance of remaining in the ED until their evaluation is complete.     Conrad [place], PA-C 11/19/21 2048

## 2021-12-23 ENCOUNTER — Other Ambulatory Visit: Payer: Self-pay

## 2021-12-23 ENCOUNTER — Emergency Department (HOSPITAL_COMMUNITY)
Admission: EM | Admit: 2021-12-23 | Discharge: 2021-12-23 | Disposition: A | Discharge status: Home / Self Care | Payer: Self-pay | Attending: Emergency Medicine | Admitting: Emergency Medicine

## 2021-12-23 ENCOUNTER — Other Ambulatory Visit (HOSPITAL_COMMUNITY): Payer: Self-pay

## 2021-12-23 ENCOUNTER — Encounter (HOSPITAL_COMMUNITY): Payer: Self-pay

## 2021-12-23 DIAGNOSIS — Z91040 Latex allergy status: Secondary | ICD-10-CM | POA: Insufficient documentation

## 2021-12-23 DIAGNOSIS — K0889 Other specified disorders of teeth and supporting structures: Secondary | ICD-10-CM | POA: Insufficient documentation

## 2021-12-23 MED ORDER — IBUPROFEN 800 MG PO TABS
800.0000 mg | ORAL_TABLET | Freq: Three times a day (TID) | ORAL | 0 refills | Status: DC
  Filled 2021-12-23: qty 21, 7d supply, fill #0

## 2021-12-23 MED ORDER — DEXAMETHASONE SODIUM PHOSPHATE 10 MG/ML IJ SOLN: 10.0000 mg | Freq: Once | INTRAMUSCULAR | Status: DC

## 2021-12-23 MED ORDER — DEXAMETHASONE SODIUM PHOSPHATE 10 MG/ML IJ SOLN
10.0000 mg | Freq: Once | INTRAMUSCULAR | Status: AC
  Administered 2021-12-23: 10 mg via INTRAMUSCULAR
  Filled 2021-12-23: qty 1

## 2021-12-23 NOTE — Discharge Instructions (Signed)
You were seen for dental pain in the ED. You were given a shot of steroids in the ED, this will stay in your system for 3 days and help reduce inflammation.  Continue taking the Tylenol and Motrin at home.  Follow-up with dentistry, information is provided for our dentist, call them and see when they can see you.  You can also try [facility] dental school sometimes they are able to see you sooner with or without insurance.  Return to the emergency department if you are unable to swallow, have swelling on 1 side of your face, new or concerning symptoms.

## 2021-12-23 NOTE — ED Provider Notes (Signed)
[facility] EMERGENCY DEPARTMENT Provider Note   CSN: [id number] Arrival date & time: 12/23/21  1322     History {Add pertinent medical, surgical, social history, OB history to HPI:1} Chief Complaint  Patient presents with   Dental Pain    Madison Cantu is a 31 y.o. female.   Dental Pain   Patient presents due to left lower jaw dental pain.  This is going on for multiple months, the pain is intermittent.  Feels a sharp pain, its worse with eating or drinking or any cold or warm foods or liquids.  Denies any fevers or chills, no dysphagia.  Not having any facial swelling or new erythema.  Patient was seen in the ED a few months ago, given Keflex due to allergies which did not improve the symptoms at all per patient.  She is been trying Tylenol Motrin with some relief but the pain keeps persisting.  She is been try to get a dentist but does not have insurance that she is been having difficulty with that.  Home Medications Prior to Admission medications   Medication Sig Start Date End Date Taking? Authorizing Provider  albuterol (VENTOLIN HFA) 108 (90 Base) MCG/ACT inhaler Inhale 2 puffs into the lungs every 6 (six) hours as needed for wheezing or shortness of breath. 01/21/21   Jeannie Fend, PA-C  amLODipine (NORVASC) 10 MG tablet Take 1 tablet (10 mg total) by mouth daily. 07/08/20   Grayce Sessions, NP  fluticasone (FLONASE) 50 MCG/ACT nasal spray Place 2 sprays into both nostrils daily for 14 days. 08/23/21 09/06/21  Leath-Warren, Sadie Haber, NP  guaiFENesin (MUCINEX) 600 MG 12 hr tablet Take 1 tablet (600 mg total) by mouth 2 (two) times daily as needed to loosen phlegm or cough. 07/03/21   Crain, Whitney L, PA  hydrochlorothiazide (HYDRODIURIL) 25 MG tablet Take 1 tablet (25 mg total) by mouth daily. 07/08/20   Grayce Sessions, NP  HYDROcodone-acetaminophen (NORCO/VICODIN) 5-325 MG tablet Take 1 tablet by mouth every 4 (four) hours as needed for moderate pain.  12/06/20   Gilda Crease, MD  montelukast (SINGULAIR) 10 MG tablet Take 1 tablet (10 mg total) by mouth at bedtime. 07/03/21   Crain, Whitney L, PA  ondansetron (ZOFRAN-ODT) 4 MG disintegrating tablet Take 1 tablet (4 mg total) by mouth every 8 (eight) hours as needed for nausea or vomiting. 10/02/21   Marita Kansas, PA-C  predniSONE (DELTASONE) 50 MG tablet Take 1 tablet (50 mg total) by mouth daily with breakfast. 07/03/21   Crain, Whitney L, PA  metFORMIN (GLUCOPHAGE) 500 MG tablet Take 1 tablet (500 mg total) by mouth 3 (three) times daily. 04/27/18 04/26/19  Loletta Specter, PA-C  Norgestimate-Ethinyl Estradiol Triphasic (ORTHO TRI-CYCLEN, 28,) 0.18/0.215/0.25 MG-35 MCG tablet Take 1 tablet by mouth daily. Patient not taking: Reported on 04/27/2018 11/18/17 02/07/19  Loletta Specter, PA-C  omeprazole (PRILOSEC) 40 MG capsule Take 1 capsule (40 mg total) by mouth daily. 04/27/18 04/26/19  Loletta Specter, PA-C      Allergies    Citrus, Ciprocinonide [fluocinolone], Clindamycin/lincomycin, Latex, Penicillins, Shellfish allergy, and Aspirin    Review of Systems   Review of Systems  Physical Exam Updated Vital Signs BP (!) 149/99 (BP Location: Right Arm)   Pulse 76   Temp 98.6 F (37 C) (Oral)   Resp 18   Ht 5\' 6"  (1.676 m)   Wt (!) 147.4 kg   SpO2 99%   BMI 52.46 kg/m  Physical Exam Vitals and nursing note reviewed. Exam conducted with a chaperone present.  Constitutional:      General: She is not in acute distress.    Appearance: Normal appearance.  HENT:     Head: Normocephalic and atraumatic.     Mouth/Throat:     Comments: Poor dentition.  Uvula is midline, normal phonation.  Dental fracture to left lower jaw, no exposed pulp.  No fluctuance or appreciable abscess.  No sublingual swelling or tenderness. Eyes:     General: No scleral icterus.    Extraocular Movements: Extraocular movements intact.     Pupils: Pupils are equal, round, and reactive to light.   Cardiovascular:     Rate and Rhythm: Normal rate and regular rhythm.  Musculoskeletal:     Cervical back: Normal range of motion.  Lymphadenopathy:     Cervical: No cervical adenopathy.  Skin:    Coloration: Skin is not jaundiced.  Neurological:     Mental Status: She is alert. Mental status is at baseline.     Coordination: Coordination normal.     ED Results / Procedures / Treatments   Labs (all labs ordered are listed, but only abnormal results are displayed) Labs Reviewed - No data to display  EKG None  Radiology No results found.  Procedures Procedures  {Document cardiac monitor, telemetry assessment procedure when appropriate:1}  Medications Ordered in ED Medications - No data to display  ED Course/ Medical Decision Making/ A&P                           Medical Decision Making  Patient presents due to dental pain.    I reviewed previous ED note, patient's girlfriend is also at bedside providing event history.  Handling secretions, physical exam is not consistent with Ludwig angina.  Considered but also not consistent with peritonsillar or retropharyngeal abscess.  I do not appreciate any obvious dental abscess and do not think antibiotics are indicated especially given lack of improvement previously.  We will try steroids, with inflammation will refer to dentist here.  Also discussed follow-up possibility with dental school program such as [facility] dent[facility].    {Document critical care time when appropriate:1} {Document review of labs and clinical decision tools ie heart score, Chads2Vasc2 etc:1}  {Document your independent review of radiology images, and any outside records:1} {Document your discussion with family members, caretakers, and with consultants:1} {Document social determinants of health affecting pt's care:1} {Document your decision making why or why not admission, treatments were needed:1} Final Clinical Impression(s) / ED Diagnoses Final diagnoses:   None    Rx / DC Orders ED Discharge Orders     None

## 2021-12-23 NOTE — ED Triage Notes (Signed)
Pt arrived POV from home c/o left sided dental pain. Pt states it hurts on the bottom left side. Pt states this has been on going for several months now and she was seen her a while back and given an antibiotic but it did not help. Pt states she does not have insurance and has been trying to get into a dentist but is not having any luck.

## 2021-12-23 NOTE — ED Provider Triage Note (Signed)
Emergency Medicine Provider Triage Evaluation Note  Conrad Bienville , a 31 y.o. female  was evaluated in triage.  Pt complains of dental pain.  Is having worsening left lower dental pain.  Is been going on for months, worsening.  Worse with hot and cold liquid.  Seen here in the ED previously given antibiotics which did not help.  Has not been able to follow-up with dentistry.  Review of Systems  Positive: Left lower dental pain Negative: Fever, facial swelling  Physical Exam  BP (!) 149/99 (BP Location: Right Arm)   Pulse 76   Temp 98.6 F (37 C) (Oral)   Resp 18   Ht 5\' 6"  (1.676 m)   Wt (!) 147.4 kg   SpO2 99%   BMI 52.46 kg/m  Gen:   Awake, no distress   Resp:  Normal effort  Mouth:  Poor dentition, missing teeth, dental caries.  She has obvious fractured left lower dentition.  No Ludwig's angina MSK:   Moves extremities without difficulty  Other:    Medical Decision Making  Medically screening exam initiated at 2:29 PM.  Appropriate orders placed.  TASHIANNA BROOME was informed that the remainder of the evaluation will be completed by another provider, this initial triage assessment does not replace that evaluation, and the importance of remaining in the ED until their evaluation is complete.  Dental pain   Jackilyn Umphlett A, PA-C 12/23/21 1430

## 2021-12-31 ENCOUNTER — Other Ambulatory Visit (HOSPITAL_COMMUNITY): Payer: Self-pay

## 2022-01-22 ENCOUNTER — Encounter (HOSPITAL_COMMUNITY): Payer: Self-pay

## 2022-01-22 ENCOUNTER — Other Ambulatory Visit: Payer: Self-pay

## 2022-01-22 ENCOUNTER — Emergency Department (HOSPITAL_COMMUNITY)
Admission: EM | Admit: 2022-01-22 | Discharge: 2022-01-23 | Disposition: A | Discharge status: Home / Self Care | Payer: Self-pay | Attending: Emergency Medicine | Admitting: Emergency Medicine

## 2022-01-22 DIAGNOSIS — J45909 Unspecified asthma, uncomplicated: Secondary | ICD-10-CM | POA: Insufficient documentation

## 2022-01-22 DIAGNOSIS — K0889 Other specified disorders of teeth and supporting structures: Secondary | ICD-10-CM | POA: Insufficient documentation

## 2022-01-22 DIAGNOSIS — Z7951 Long term (current) use of inhaled steroids: Secondary | ICD-10-CM | POA: Insufficient documentation

## 2022-01-22 DIAGNOSIS — Z79899 Other long term (current) drug therapy: Secondary | ICD-10-CM | POA: Insufficient documentation

## 2022-01-22 DIAGNOSIS — Z91040 Latex allergy status: Secondary | ICD-10-CM | POA: Insufficient documentation

## 2022-01-22 DIAGNOSIS — I10 Essential (primary) hypertension: Secondary | ICD-10-CM | POA: Insufficient documentation

## 2022-01-22 NOTE — ED Triage Notes (Signed)
Pt reports having a left lower toothache x 1 year intermittently. Pt has a broken tooth.

## 2022-01-22 NOTE — ED Provider Triage Note (Signed)
Emergency Medicine Provider Triage Evaluation Note  Madison Cantu , a 31 y.o. female  was evaluated in triage.  Pt complains of dental pain. States that she has had dental pain in the left lower molar intermittently for 1 year. States that she had her wisdom teeth removed and they chipped the molar next to it. Denies recent facial swelling or fevers. Having difficulty eating foods due to pain  Review of Systems  Positive: See above Negative:   Physical Exam  BP (!) 160/135 (BP Location: Left Arm)   Pulse 79   Temp 98.8 F (37.1 C) (Oral)   Resp 20   SpO2 96%  Gen:   Awake, no distress   Resp:  Normal effort  MSK:   Moves extremities without difficulty  Other:  Poor dentition  Medical Decision Making  Medically screening exam initiated at 11:30 PM.  Appropriate orders placed.  Madison Cantu was informed that the remainder of the evaluation will be completed by another provider, this initial triage assessment does not replace that evaluation, and the importance of remaining in the ED until their evaluation is complete.     Cristopher Peru, PA-C 01/22/22 [phone]

## 2022-01-23 ENCOUNTER — Encounter (HOSPITAL_COMMUNITY): Payer: Self-pay | Admitting: Student

## 2022-01-23 MED ORDER — METRONIDAZOLE 500 MG PO TABS: 500.0000 mg | ORAL_TABLET | Freq: Two times a day (BID) | ORAL | 0 refills | Status: AC

## 2022-01-23 MED ORDER — LIDOCAINE VISCOUS HCL 2 % MT SOLN: 15.0000 mL | OROMUCOSAL | 0 refills | Status: AC | PRN

## 2022-01-23 MED ORDER — LIDOCAINE VISCOUS HCL 2 % MT SOLN
15.0000 mL | Freq: Once | OROMUCOSAL | Status: AC
  Administered 2022-01-23: 15 mL via OROMUCOSAL
  Filled 2022-01-23: qty 15

## 2022-01-23 MED ORDER — METRONIDAZOLE 500 MG PO TABS
500.0000 mg | ORAL_TABLET | Freq: Once | ORAL | Status: AC
  Administered 2022-01-23: 500 mg via ORAL
  Filled 2022-01-23: qty 1

## 2022-01-23 MED ORDER — KETOROLAC TROMETHAMINE 30 MG/ML IJ SOLN
30.0000 mg | Freq: Once | INTRAMUSCULAR | Status: AC
  Administered 2022-01-23: 30 mg via INTRAMUSCULAR
  Filled 2022-01-23: qty 1

## 2022-01-23 MED ORDER — CEFDINIR 300 MG PO CAPS
300.0000 mg | ORAL_CAPSULE | Freq: Once | ORAL | Status: AC
  Administered 2022-01-23: 300 mg via ORAL
  Filled 2022-01-23: qty 1

## 2022-01-23 MED ORDER — NAPROXEN 500 MG PO TABS: 500.0000 mg | ORAL_TABLET | Freq: Two times a day (BID) | ORAL | 0 refills | Status: AC | PRN

## 2022-01-23 MED ORDER — CEFDINIR 300 MG PO CAPS: 300.0000 mg | ORAL_CAPSULE | Freq: Two times a day (BID) | ORAL | 0 refills | Status: DC

## 2022-01-23 NOTE — ED Provider Notes (Signed)
[facility]-EMERGENCY DEPT Provider Note   CSN: [id number] Arrival date & time: 01/22/22  2302     History  Chief Complaint  Patient presents with   Dental Pain    Madison Cantu is a 31 y.o. female with a hx of asthma and hypertension who presents to the ED with complaints of dental pain worsened over the past 3 days. Patient reports she has a tooth that was chipped during her wisdom tooth extraction which intermittently gives her problems. Started to hurt more 3 days ago, pain is constant, associated w/ facial swelling. Denies fever, chills, vomiting or dyspnea. Does not have a dentist locally- states she has called some that have extended wait times for appointments.   HPI     Home Medications Prior to Admission medications   Medication Sig Start Date End Date Taking? Authorizing Provider  albuterol (VENTOLIN HFA) 108 (90 Base) MCG/ACT inhaler Inhale 2 puffs into the lungs every 6 (six) hours as needed for wheezing or shortness of breath. 01/21/21   Jeannie Fend, PA-C  amLODipine (NORVASC) 10 MG tablet Take 1 tablet (10 mg total) by mouth daily. 07/08/20   Grayce Sessions, NP  fluticasone (FLONASE) 50 MCG/ACT nasal spray Place 2 sprays into both nostrils daily for 14 days. 08/23/21 09/06/21  Leath-Warren, Sadie Haber, NP  guaiFENesin (MUCINEX) 600 MG 12 hr tablet Take 1 tablet (600 mg total) by mouth 2 (two) times daily as needed to loosen phlegm or cough. 07/03/21   Crain, Whitney L, PA  hydrochlorothiazide (HYDRODIURIL) 25 MG tablet Take 1 tablet (25 mg total) by mouth daily. 07/08/20   Grayce Sessions, NP  HYDROcodone-acetaminophen (NORCO/VICODIN) 5-325 MG tablet Take 1 tablet by mouth every 4 (four) hours as needed for moderate pain. 12/06/20   Gilda Crease, MD  ibuprofen (ADVIL) 800 MG tablet Take 1 tablet (800 mg total) by mouth 3 (three) times daily. 12/23/21   Theron Arista, PA-C  montelukast (SINGULAIR) 10 MG tablet Take 1 tablet (10 mg total)  by mouth at bedtime. 07/03/21   Crain, Whitney L, PA  ondansetron (ZOFRAN-ODT) 4 MG disintegrating tablet Take 1 tablet (4 mg total) by mouth every 8 (eight) hours as needed for nausea or vomiting. 10/02/21   Marita Kansas, PA-C  predniSONE (DELTASONE) 50 MG tablet Take 1 tablet (50 mg total) by mouth daily with breakfast. 07/03/21   Crain, Whitney L, PA  metFORMIN (GLUCOPHAGE) 500 MG tablet Take 1 tablet (500 mg total) by mouth 3 (three) times daily. 04/27/18 04/26/19  Loletta Specter, PA-C  Norgestimate-Ethinyl Estradiol Triphasic (ORTHO TRI-CYCLEN, 28,) 0.18/0.215/0.25 MG-35 MCG tablet Take 1 tablet by mouth daily. Patient not taking: Reported on 04/27/2018 11/18/17 02/07/19  Loletta Specter, PA-C  omeprazole (PRILOSEC) 40 MG capsule Take 1 capsule (40 mg total) by mouth daily. 04/27/18 04/26/19  Loletta Specter, PA-C      Allergies    Citrus, Ciprocinonide [fluocinolone], Clindamycin/lincomycin, Latex, Penicillins, Shellfish allergy, and Aspirin    Review of Systems   Review of Systems  Constitutional:  Negative for chills and fever.  HENT:  Positive for dental problem and facial swelling.   Respiratory:  Negative for shortness of breath.   Cardiovascular:  Negative for chest pain.  Gastrointestinal:  Negative for abdominal pain and vomiting.  All other systems reviewed and are negative.   Physical Exam Updated Vital Signs BP (!) 160/135 (BP Location: Left Arm)   Pulse 79   Temp 98.8 F (37.1 C) (Oral)  Resp 20   Ht 5\' 6"  (1.676 m)   Wt (!) 147.9 kg   SpO2 96%   BMI 52.62 kg/m  Physical Exam Vitals and nursing note reviewed.  Constitutional:      General: She is not in acute distress.    Appearance: She is well-developed. She is not toxic-appearing.  HENT:     Head: Normocephalic and atraumatic.     Right Ear: Tympanic membrane is not perforated, erythematous, retracted or bulging.     Left Ear: Tympanic membrane is not perforated, erythematous, retracted or bulging.      Nose: Nose normal.     Mouth/Throat:     Pharynx: Uvula midline. No oropharyngeal exudate, posterior oropharyngeal erythema or uvula swelling.     Tonsils: No tonsillar abscesses.      Comments: Poor dentition w/ multiple fillings. Some absent teeth.  Posterior oropharynx is symmetric appearing. Patient tolerating own secretions without difficulty. No trismus. No drooling. No hot potato voice. No swelling beneath the tongue, submandibular compartment is soft.    Eyes:     General:        Right eye: No discharge.        Left eye: No discharge.     Conjunctiva/sclera: Conjunctivae normal.  Musculoskeletal:     Cervical back: Normal range of motion and neck supple.  Lymphadenopathy:     Cervical: No cervical adenopathy.  Neurological:     Mental Status: She is alert.  Psychiatric:        Behavior: Behavior normal.        Thought Content: Thought content normal.     ED Results / Procedures / Treatments   Labs (all labs ordered are listed, but only abnormal results are displayed) Labs Reviewed - No data to display  EKG None  Radiology No results found.  Procedures Procedures    Medications Ordered in ED Medications - No data to display  ED Course/ Medical Decision Making/ A&P                           Medical Decision Making  Patient presents with dental pain. Patient is nontoxic appearing, vitals with elevated BP- low suspicion for HTN emergency- PCP follow up, otherwise without significant abnormality. No gross abscess.  Exam unconcerning for Ludwig's angina or deep space infection at this time.  Does have swelling/erythema w/ TTP, some facial swelling, will tx for infection. Will treat with Cefdinir/flagyl given her allergies and Naproxen & lidocaine to help with pain.  Urged patient to follow-up with dentist, dental resources were provided.  Discussed treatment plan and need for follow up as well as return precautions. Provided opportunity for questions, patient  confirmed understanding and is agreeable to plan.   Final Clinical Impression(s) / ED Diagnoses Final diagnoses:  Pain, dental    Rx / DC Orders ED Discharge Orders          Ordered    metroNIDAZOLE (FLAGYL) 500 MG tablet  2 times daily        01/23/22 0109    cefdinir (OMNICEF) 300 MG capsule  2 times daily        01/23/22 0109    lidocaine (XYLOCAINE) 2 % solution  Every 4 hours PRN        01/23/22 0109    naproxen (NAPROSYN) 500 MG tablet  2 times daily PRN        01/23/22 0109  Cherly Anderson, PA-C 01/23/22 0113    Geoffery Lyons, MD 01/23/22 [phone]

## 2022-01-23 NOTE — Discharge Instructions (Addendum)
Call one of the dentists offices provided to schedule an appointment for re-evaluation and further management within the next 48 hours.   I have prescribed you cefdinir & flagyl which are antibiotics to treat the infection and Naproxen which is an anti-inflammatory medicine to treat the pain as well as lidocaine to help numb the area.   Please take all of your antibiotics until finished. You may develop abdominal discomfort or diarrhea from the antibiotic.  You may help offset this with probiotics which you can buy at the store (ask your pharmacist if unable to find) or get probiotics in the form of eating yogurt. Do not eat or take the probiotics until 2 hours after your antibiotic. If you are unable to tolerate these side effects follow-up with your primary care provider or return to the emergency department.   If you begin to experience any blistering, rashes, swelling, or difficulty breathing seek medical care for evaluation of potentially more serious side effects.   Be sure to eat something when taking the Naproxen as it can cause stomach upset and at worst stomach bleeding. Do not take additional non steroidal anti-inflammatory medicines such as Ibuprofen, Aleve, Advil, Mobic, Diclofenac, or goodie powder while taking Naproxen. You may supplement with Tylenol.   Do not drink alcohol when taking Flagyl as it can cause significant vomiting/GI illness.  We have prescribed you new medication(s) today. Discuss the medications prescribed today with your pharmacist as they can have adverse effects and interactions with your other medicines including over the counter and prescribed medications. Seek medical evaluation if you start to experience new or abnormal symptoms after taking one of these medicines, seek care immediately if you start to experience difficulty breathing, feeling of your throat closing, facial swelling, or rash as these could be indications of a more serious allergic reaction  If you  start to experience and new or worsening symptoms return to the emergency department. If you start to experience fever, chills, neck stiffness/pain, or inability to move your neck or open your mouth come back to the emergency department immediately.    Follow-up with primary care within 2 weeks for recheck of your blood pressure as it was elevated in the ER today.

## 2022-02-03 ENCOUNTER — Emergency Department (HOSPITAL_COMMUNITY)
Admission: EM | Admit: 2022-02-03 | Discharge: 2022-02-03 | Disposition: A | Discharge status: Home / Self Care | Payer: Self-pay | Attending: Emergency Medicine | Admitting: Emergency Medicine

## 2022-02-03 ENCOUNTER — Other Ambulatory Visit: Payer: Self-pay

## 2022-02-03 ENCOUNTER — Other Ambulatory Visit (HOSPITAL_COMMUNITY): Payer: Self-pay

## 2022-02-03 ENCOUNTER — Encounter (HOSPITAL_COMMUNITY): Payer: Self-pay

## 2022-02-03 DIAGNOSIS — S60229A Contusion of unspecified hand, initial encounter: Secondary | ICD-10-CM | POA: Insufficient documentation

## 2022-02-03 DIAGNOSIS — S0083XA Contusion of other part of head, initial encounter: Secondary | ICD-10-CM | POA: Insufficient documentation

## 2022-02-03 DIAGNOSIS — T07XXXA Unspecified multiple injuries, initial encounter: Secondary | ICD-10-CM

## 2022-02-03 DIAGNOSIS — Z91040 Latex allergy status: Secondary | ICD-10-CM | POA: Insufficient documentation

## 2022-02-03 MED ORDER — OXYCODONE-ACETAMINOPHEN 5-325 MG PO TABS
1.0000 | ORAL_TABLET | Freq: Once | ORAL | Status: AC
  Administered 2022-02-03: 1 via ORAL
  Filled 2022-02-03: qty 1

## 2022-02-03 MED ORDER — OXYCODONE-ACETAMINOPHEN 5-325 MG PO TABS
1.0000 | ORAL_TABLET | Freq: Four times a day (QID) | ORAL | 0 refills | Status: AC | PRN
  Filled 2022-02-03: qty 10, 3d supply, fill #0
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
## 2022-04-07 ENCOUNTER — Other Ambulatory Visit: Payer: Self-pay

## 2022-04-07 ENCOUNTER — Encounter (HOSPITAL_COMMUNITY): Payer: Self-pay

## 2022-04-07 ENCOUNTER — Emergency Department (HOSPITAL_COMMUNITY)
Admission: EM | Admit: 2022-04-07 | Discharge: 2022-04-07 | Discharge status: Against Medical Advice | Payer: Self-pay | Attending: Emergency Medicine | Admitting: Emergency Medicine

## 2022-04-07 DIAGNOSIS — Z5321 Procedure and treatment not carried out due to patient leaving prior to being seen by health care provider: Secondary | ICD-10-CM | POA: Insufficient documentation

## 2022-04-07 DIAGNOSIS — K0381 Cracked tooth: Secondary | ICD-10-CM | POA: Insufficient documentation

## 2022-04-07 NOTE — ED Triage Notes (Signed)
Pt. Arrives POV c/o dental pain on the left side. Pt states that she had her wisdom teeth pulled a few weeks ago and they chipped her bottom tooth. Pt. States that pain medicine doesn't feel like it is working anymore.

## 2022-04-29 ENCOUNTER — Encounter (HOSPITAL_COMMUNITY): Payer: Self-pay

## 2022-04-29 ENCOUNTER — Emergency Department (HOSPITAL_COMMUNITY): Payer: Self-pay

## 2022-04-29 ENCOUNTER — Emergency Department (HOSPITAL_COMMUNITY)
Admission: EM | Admit: 2022-04-29 | Discharge: 2022-04-29 | Disposition: A | Discharge status: Home / Self Care | Payer: Self-pay | Attending: Emergency Medicine | Admitting: Emergency Medicine

## 2022-04-29 ENCOUNTER — Other Ambulatory Visit: Payer: Self-pay

## 2022-04-29 ENCOUNTER — Other Ambulatory Visit (HOSPITAL_COMMUNITY): Payer: Self-pay

## 2022-04-29 DIAGNOSIS — Z91040 Latex allergy status: Secondary | ICD-10-CM | POA: Insufficient documentation

## 2022-04-29 DIAGNOSIS — I10 Essential (primary) hypertension: Secondary | ICD-10-CM | POA: Insufficient documentation

## 2022-04-29 DIAGNOSIS — J45909 Unspecified asthma, uncomplicated: Secondary | ICD-10-CM | POA: Insufficient documentation

## 2022-04-29 DIAGNOSIS — J111 Influenza due to unidentified influenza virus with other respiratory manifestations: Secondary | ICD-10-CM

## 2022-04-29 DIAGNOSIS — Z7951 Long term (current) use of inhaled steroids: Secondary | ICD-10-CM | POA: Insufficient documentation

## 2022-04-29 DIAGNOSIS — Z1152 Encounter for screening for COVID-19: Secondary | ICD-10-CM | POA: Insufficient documentation

## 2022-04-29 DIAGNOSIS — Z7984 Long term (current) use of oral hypoglycemic drugs: Secondary | ICD-10-CM | POA: Insufficient documentation

## 2022-04-29 DIAGNOSIS — Z79899 Other long term (current) drug therapy: Secondary | ICD-10-CM | POA: Insufficient documentation

## 2022-04-29 DIAGNOSIS — J101 Influenza due to other identified influenza virus with other respiratory manifestations: Secondary | ICD-10-CM | POA: Insufficient documentation

## 2022-04-29 LAB — BASIC METABOLIC PANEL
Anion gap: 6 (ref 5–15)
BUN: 8 mg/dL (ref 6–20)
CO2: 24 mmol/L (ref 22–32)
Calcium: 8.9 mg/dL (ref 8.9–10.3)
Chloride: 106 mmol/L (ref 98–111)
Creatinine, Ser: 0.78 mg/dL (ref 0.44–1.00)
GFR, Estimated: >60 mL/min (ref >60)
Glucose, Bld: 99 mg/dL (ref 70–99)
Potassium: 3.6 mmol/L (ref 3.5–5.1)
Sodium: 136 mmol/L (ref 135–145)

## 2022-04-29 LAB — RESP PANEL BY RT-PCR (FLU A&B, COVID) ARPGX2
Influenza A by PCR: NEGATIVE
Influenza B by PCR: POSITIVE — AB
SARS Coronavirus 2 by RT PCR: NEGATIVE

## 2022-04-29 LAB — CBC WITH DIFFERENTIAL/PLATELET
Abs Immature Granulocytes: 0.02 10*3/uL (ref 0.00–0.07)
Basophils Absolute: 0 10*3/uL (ref 0.0–0.1)
Basophils Relative: 0 %
Eosinophils Absolute: 0.3 10*3/uL (ref 0.0–0.5)
Eosinophils Relative: 7 %
HCT: 38.4 % (ref 36.0–46.0)
Hemoglobin: 12.5 g/dL (ref 12.0–15.0)
Immature Granulocytes: 1 %
Lymphocytes Relative: 27 %
Lymphs Abs: 1.2 10*3/uL (ref 0.7–4.0)
MCH: 28 pg (ref 26.0–34.0)
MCHC: 32.6 g/dL (ref 30.0–36.0)
MCV: 86.1 fL (ref 80.0–100.0)
Monocytes Absolute: 0.5 10*3/uL (ref 0.1–1.0)
Monocytes Relative: 12 %
Neutro Abs: 2.2 10*3/uL (ref 1.7–7.7)
Neutrophils Relative %: 53 %
Platelets: 243 10*3/uL (ref 150–400)
RBC: 4.46 MIL/uL (ref 3.87–5.11)
RDW: 15.6 % — ABNORMAL HIGH (ref 11.5–15.5)
WBC: 4.2 10*3/uL (ref 4.0–10.5)
nRBC: 0 % (ref 0.0–0.2)

## 2022-04-29 MED ORDER — OSELTAMIVIR PHOSPHATE 75 MG PO CAPS
75.0000 mg | ORAL_CAPSULE | Freq: Two times a day (BID) | ORAL | 0 refills | Status: AC
  Filled 2022-04-29: qty 10, 5d supply, fill #0

## 2022-04-29 MED ORDER — BENZONATATE 100 MG PO CAPS
100.0000 mg | ORAL_CAPSULE | Freq: Three times a day (TID) | ORAL | 0 refills | Status: AC
  Filled 2022-04-29: qty 21, 7d supply, fill #0

## 2022-04-29 NOTE — ED Provider Triage Note (Signed)
Emergency Medicine Provider Triage Evaluation Note  Madison Cantu , a 31 y.o. female  was evaluated in triage.  Pt complains of 2 days of URI symptoms including nasal congestion, cough, SOB. Has some central chest tightness as well as subjective fever and chills. Has been using her albuterol w/o relief. No known sick contacts. States symptoms feel similar to when she had COVID in the past.  Review of Systems  Positive: As above Negative: As above  Physical Exam  BP (!) 152/112   Pulse 84   Temp 98.2 F (36.8 C)   Resp 20   Ht 5\' 5"  (1.651 m)   Wt (!) 148.3 kg   SpO2 100%   BMI 54.42 kg/m  Gen:   Awake, no distress   Resp:  Normal effort. Lungs CTAB. MSK:   Moves extremities without difficulty  Other:  Obese AA female  Medical Decision Making  Medically screening exam initiated at 5:32 AM.  Appropriate orders placed.  HENSLEY AZIZ was informed that the remainder of the evaluation will be completed by another provider, this initial triage assessment does not replace that evaluation, and the importance of remaining in the ED until their evaluation is complete.  URI symptoms w/atypical chest pain - work up initiated.   Conrad Leeds, PA-C 04/29/22 [phone]

## 2022-04-29 NOTE — Discharge Instructions (Signed)
The Tessalon is to help you with your cough.  Tamiflu is a medication that you can take specifically to help shorten the course and reduce the symptoms of your influenza.  It needs to be started within 5 days of your symptom onset.  You can also take Sudafed.  The pharmacist has that behind their desk.  I recommend that version to help you with your nasal congestion.  You can also take ibuprofen or Tylenol to help with aches, fevers and pains.

## 2022-04-29 NOTE — ED Notes (Signed)
Discharge instructions reviewed with patient. Patient denies any questions or concerns. Patient out of ED with family member.

## 2022-04-29 NOTE — ED Provider Notes (Signed)
[facility] EMERGENCY DEPARTMENT Provider Note   CSN: [id number] Arrival date & time: 04/29/22  0503     History  Chief Complaint  Patient presents with   Generalized Body Aches   Shortness of Breath    Madison Cantu is a 31 y.o. female.   Shortness of Breath    Patient has a history of hypertension and asthma.  She presents to the ED for evaluation of cough URI symptoms.  Patient states she has had the symptoms for a couple of days now.  She has had some cough and shortness of breath.  She has had some chest tightness as well as feeling feverish.  She tried her albuterol without relief.  No vomiting or diarrhea.  Home Medications Prior to Admission medications   Medication Sig Start Date End Date Taking? Authorizing Provider  benzonatate (TESSALON) 100 MG capsule Take 1 capsule (100 mg total) by mouth every 8 (eight) hours. 04/29/22  Yes Linwood Dibbles, MD  oseltamivir (TAMIFLU) 75 MG capsule Take 1 capsule (75 mg total) by mouth every 12 (twelve) hours. 04/29/22  Yes Linwood Dibbles, MD  albuterol (VENTOLIN HFA) 108 (90 Base) MCG/ACT inhaler Inhale 2 puffs into the lungs every 6 (six) hours as needed for wheezing or shortness of breath. 01/21/21   Jeannie Fend, PA-C  amLODipine (NORVASC) 10 MG tablet Take 1 tablet (10 mg total) by mouth daily. 07/08/20   Grayce Sessions, NP  cefdinir (OMNICEF) 300 MG capsule Take 1 capsule (300 mg total) by mouth 2 (two) times daily. 01/23/22   Petrucelli, Samantha R, PA-C  fluticasone (FLONASE) 50 MCG/ACT nasal spray Place 2 sprays into both nostrils daily for 14 days. 08/23/21 09/06/21  Leath-Warren, Sadie Haber, NP  guaiFENesin (MUCINEX) 600 MG 12 hr tablet Take 1 tablet (600 mg total) by mouth 2 (two) times daily as needed to loosen phlegm or cough. 07/03/21   Crain, Whitney L, PA  hydrochlorothiazide (HYDRODIURIL) 25 MG tablet Take 1 tablet (25 mg total) by mouth daily. 07/08/20   Grayce Sessions, NP  HYDROcodone-acetaminophen  (NORCO/VICODIN) 5-325 MG tablet Take 1 tablet by mouth every 4 (four) hours as needed for moderate pain. 12/06/20   Gilda Crease, MD  ibuprofen (ADVIL) 800 MG tablet Take 1 tablet (800 mg total) by mouth 3 (three) times daily. 12/23/21   Theron Arista, PA-C  lidocaine (XYLOCAINE) 2 % solution Use as directed 15 mLs in the mouth or throat every 4 (four) hours as needed for mouth pain. 01/23/22   Petrucelli, Samantha R, PA-C  metroNIDAZOLE (FLAGYL) 500 MG tablet Take 1 tablet (500 mg total) by mouth 2 (two) times daily. 01/23/22   Petrucelli, Samantha R, PA-C  montelukast (SINGULAIR) 10 MG tablet Take 1 tablet (10 mg total) by mouth at bedtime. 07/03/21   Crain, Whitney L, PA  naproxen (NAPROSYN) 500 MG tablet Take 1 tablet (500 mg total) by mouth 2 (two) times daily as needed for moderate pain. 01/23/22   Petrucelli, Samantha R, PA-C  ondansetron (ZOFRAN-ODT) 4 MG disintegrating tablet Take 1 tablet (4 mg total) by mouth every 8 (eight) hours as needed for nausea or vomiting. 10/02/21   Marita Kansas, PA-C  oxyCODONE-acetaminophen (PERCOCET/ROXICET) 5-325 MG tablet Take 1 tablet by mouth every 6 (six) hours as needed for severe pain. 02/03/22   Lorre Nick, MD  predniSONE (DELTASONE) 50 MG tablet Take 1 tablet (50 mg total) by mouth daily with breakfast. 07/03/21   Crain, Whitney L, PA  metFORMIN (  GLUCOPHAGE) 500 MG tablet Take 1 tablet (500 mg total) by mouth 3 (three) times daily. 04/27/18 04/26/19  Loletta Specter, PA-C  Norgestimate-Ethinyl Estradiol Triphasic (ORTHO TRI-CYCLEN, 28,) 0.18/0.215/0.25 MG-35 MCG tablet Take 1 tablet by mouth daily. Patient not taking: Reported on 04/27/2018 11/18/17 02/07/19  Loletta Specter, PA-C  omeprazole (PRILOSEC) 40 MG capsule Take 1 capsule (40 mg total) by mouth daily. 04/27/18 04/26/19  Loletta Specter, PA-C      Allergies    Citrus, Ciprocinonide [fluocinolone], Clindamycin/lincomycin, Latex, Penicillins, Shellfish allergy, and Aspirin    Review of Systems    Review of Systems  Respiratory:  Positive for shortness of breath.     Physical Exam Updated Vital Signs BP (!) 132/90 (BP Location: Right Arm)   Pulse 69   Temp 98.5 F (36.9 C) (Oral)   Resp 16   Ht 1.651 m (5\' 5" )   Wt (!) 148.3 kg   SpO2 100%   BMI 54.42 kg/m  Physical Exam Vitals and nursing note reviewed.  Constitutional:      General: She is not in acute distress.    Appearance: She is well-developed.  HENT:     Head: Normocephalic and atraumatic.     Right Ear: External ear normal.     Left Ear: External ear normal.  Eyes:     General: No scleral icterus.       Right eye: No discharge.        Left eye: No discharge.     Conjunctiva/sclera: Conjunctivae normal.  Neck:     Trachea: No tracheal deviation.  Cardiovascular:     Rate and Rhythm: Normal rate and regular rhythm.  Pulmonary:     Effort: Pulmonary effort is normal. No respiratory distress.     Breath sounds: Normal breath sounds. No stridor. No decreased breath sounds, wheezing, rhonchi or rales.  Abdominal:     General: There is no distension.     Palpations: Abdomen is soft.     Tenderness: There is no abdominal tenderness.  Musculoskeletal:        General: No swelling or deformity.     Cervical back: Neck supple.  Skin:    General: Skin is warm and dry.     Findings: No rash.  Neurological:     Mental Status: She is alert.     Cranial Nerves: Cranial nerve deficit: no gross deficits.     ED Results / Procedures / Treatments   Labs (all labs ordered are listed, but only abnormal results are displayed) Labs Reviewed  RESP PANEL BY RT-PCR (FLU A&B, COVID) ARPGX2 - Abnormal; Notable for the following components:      Result Value   Influenza B by PCR POSITIVE (*)    All other components within normal limits  CBC WITH DIFFERENTIAL/PLATELET - Abnormal; Notable for the following components:   RDW 15.6 (*)    All other components within normal limits  BASIC METABOLIC PANEL    EKG EKG  Interpretation  Date/Time:  Wednesday April 29 2022 05:34:26 EST Ventricular Rate:  96 PR Interval:  146 QRS Duration: 80 QT Interval:  360 QTC Calculation: 454 R Axis:   52 Text Interpretation: Normal sinus rhythm Normal ECG When compared with ECG of 11-Mar-2018 17:59, No significant change was found Confirmed by 13-Mar-2018 (Dione Booze) on 04/29/2022 6:12:24 AM  Radiology DG Chest 2 View  Result Date: 04/29/2022 CLINICAL DATA:  Chest tightness. EXAM: CHEST - 2 VIEW COMPARISON:  01/21/2021 FINDINGS: The lungs  are clear without focal pneumonia, edema, pneumothorax or pleural effusion. The cardiopericardial silhouette is within normal limits for size. The visualized bony structures of the thorax are unremarkable. IMPRESSION: No active cardiopulmonary disease. Electronically Signed   By: Kennith Center M.D.   On: 04/29/2022 05:56    Procedures Procedures    Medications Ordered in ED Medications - No data to display  ED Course/ Medical Decision Making/ A&P Clinical Course as of 04/29/22 1206  Wed Apr 29, 2022  1205 Chest x-ray without signs of pneumonia.  CBC and metabolic panel unremarkable.  Influenza test is positive [JK]    Clinical Course User Index [JK] Linwood Dibbles, MD                           Medical Decision Making Problems Addressed: Influenza: acute illness or injury that poses a threat to life or bodily functions  Amount and/or Complexity of Data Reviewed Labs: ordered. Decision-making details documented in ED Course. Radiology: ordered and independent interpretation performed.  Risk Prescription drug management.   Patient presented with URI type symptoms fever cough.  Chest x-ray does not show pneumonia.  Her influenza test is positive.  Patient is afebrile here.  Nontoxic-appearing.  No respiratory distress.  No oxygen requirement.  Appears stable for discharge home supportive care.  Discussed Tamiflu.  Patient she does not necessarily have to take this medication  but she is within the 5-day window.  Also recommended Sudafed to help with her nasal congestion.        Final Clinical Impression(s) / ED Diagnoses Final diagnoses:  Influenza    Rx / DC Orders ED Discharge Orders          Ordered    oseltamivir (TAMIFLU) 75 MG capsule  Every 12 hours        04/29/22 1203    benzonatate (TESSALON) 100 MG capsule  Every 8 hours        04/29/22 1203              Linwood Dibbles, MD 04/29/22 1206

## 2022-04-29 NOTE — ED Triage Notes (Signed)
Pt arrived complaining of shortness of breath and sharp chest pains when she takes a deep breath  X2 days  Also has congestion in both nostrils   Hx of asthma, has been taking treatments

## 2022-05-15 ENCOUNTER — Other Ambulatory Visit (HOSPITAL_COMMUNITY): Payer: Self-pay

## 2022-11-30 ENCOUNTER — Emergency Department (HOSPITAL_BASED_OUTPATIENT_CLINIC_OR_DEPARTMENT_OTHER)
Admission: EM | Admit: 2022-11-30 | Discharge: 2022-11-30 | Disposition: A | Discharge status: Home / Self Care | Payer: Self-pay | Attending: Emergency Medicine | Admitting: Emergency Medicine

## 2022-11-30 ENCOUNTER — Emergency Department (HOSPITAL_BASED_OUTPATIENT_CLINIC_OR_DEPARTMENT_OTHER): Payer: Self-pay

## 2022-11-30 ENCOUNTER — Encounter (HOSPITAL_BASED_OUTPATIENT_CLINIC_OR_DEPARTMENT_OTHER): Payer: Self-pay

## 2022-11-30 ENCOUNTER — Other Ambulatory Visit: Payer: Self-pay

## 2022-11-30 DIAGNOSIS — R102 Pelvic and perineal pain: Secondary | ICD-10-CM | POA: Insufficient documentation

## 2022-11-30 DIAGNOSIS — Z91040 Latex allergy status: Secondary | ICD-10-CM | POA: Insufficient documentation

## 2022-11-30 LAB — URINALYSIS, W/ REFLEX TO CULTURE (INFECTION SUSPECTED)
Bilirubin Urine: NEGATIVE
Glucose, UA: NEGATIVE mg/dL
Ketones, ur: NEGATIVE mg/dL
Nitrite: NEGATIVE
Protein, ur: 30 mg/dL — AB
Specific Gravity, Urine: >=1.03 (ref 1.005–1.030)
pH: 6.5 (ref 5.0–8.0)

## 2022-11-30 LAB — PREGNANCY, URINE: Preg Test, Ur: NEGATIVE

## 2022-11-30 NOTE — ED Triage Notes (Signed)
Pt states she has painful lumps that appear on her lower abdomen during her menstrual cycle. Lumps have re-appeared after menstrual cycle stopped. Pt states she not sure if she is currently still on her period

## 2022-11-30 NOTE — ED Notes (Signed)
Discharge instructions reviewed with patient. Patient verbalizes understanding, no further questions at this time. Medications and follow up information provided. No acute distress noted at time of departure.  

## 2022-11-30 NOTE — ED Provider Notes (Signed)
[place] [facility]ERG[facility] DEPARTMENT AT [facility] Provider Note   CSN: [id number] Arrival date & time: 11/30/22  7829     History  Chief Complaint  Patient presents with   Abdominal Pain    Madison Cantu is a 32 y.o. female.  Patient is a 32 year old female who presents with lower abdominal cramping.  She states that her last menstrual cycle was June 26.  She not had the normal 3-day cycle.  She started having some ongoing spotting over the last few days.  She denies any other vaginal discharge.  She typically notices some knots in her lower abdomen/pelvic area with her menstrual cycle and they usually go away after her menstrual cycle is over.  She still is noticing some of the lumps and she is having cramping like her typical menstrual cycle.  She denies any abnormal discharge.  No urinary symptoms.  No vaginal pain.  No sores.       Home Medications Prior to Admission medications   Medication Sig Start Date End Date Taking? Authorizing Provider  albuterol (VENTOLIN HFA) 108 (90 Base) MCG/ACT inhaler Inhale 2 puffs into the lungs every 6 (six) hours as needed for wheezing or shortness of breath. 01/21/21   Jeannie Fend, PA-C  amLODipine (NORVASC) 10 MG tablet Take 1 tablet (10 mg total) by mouth daily. 07/08/20   Grayce Sessions, NP  benzonatate (TESSALON) 100 MG capsule Take 1 capsule (100 mg total) by mouth every 8 (eight) hours. 04/29/22   Linwood Dibbles, MD  cefdinir (OMNICEF) 300 MG capsule Take 1 capsule (300 mg total) by mouth 2 (two) times daily. 01/23/22   Petrucelli, Samantha R, PA-C  fluticasone (FLONASE) 50 MCG/ACT nasal spray Place 2 sprays into both nostrils daily for 14 days. 08/23/21 09/06/21  Leath-Warren, Sadie Haber, NP  guaiFENesin (MUCINEX) 600 MG 12 hr tablet Take 1 tablet (600 mg total) by mouth 2 (two) times daily as needed to loosen phlegm or cough. 07/03/21   Crain, Whitney L, PA  hydrochlorothiazide (HYDRODIURIL) 25 MG tablet Take 1 tablet (25 mg total)  by mouth daily. 07/08/20   Grayce Sessions, NP  HYDROcodone-acetaminophen (NORCO/VICODIN) 5-325 MG tablet Take 1 tablet by mouth every 4 (four) hours as needed for moderate pain. 12/06/20   Gilda Crease, MD  ibuprofen (ADVIL) 800 MG tablet Take 1 tablet (800 mg total) by mouth 3 (three) times daily. 12/23/21   Theron Arista, PA-C  lidocaine (XYLOCAINE) 2 % solution Use as directed 15 mLs in the mouth or throat every 4 (four) hours as needed for mouth pain. 01/23/22   Petrucelli, Samantha R, PA-C  metroNIDAZOLE (FLAGYL) 500 MG tablet Take 1 tablet (500 mg total) by mouth 2 (two) times daily. 01/23/22   Petrucelli, Samantha R, PA-C  montelukast (SINGULAIR) 10 MG tablet Take 1 tablet (10 mg total) by mouth at bedtime. 07/03/21   Crain, Whitney L, PA  naproxen (NAPROSYN) 500 MG tablet Take 1 tablet (500 mg total) by mouth 2 (two) times daily as needed for moderate pain. 01/23/22   Petrucelli, Samantha R, PA-C  ondansetron (ZOFRAN-ODT) 4 MG disintegrating tablet Take 1 tablet (4 mg total) by mouth every 8 (eight) hours as needed for nausea or vomiting. 10/02/21   Marita Kansas, PA-C  oseltamivir (TAMIFLU) 75 MG capsule Take 1 capsule (75 mg total) by mouth every 12 (twelve) hours. 04/29/22   Linwood Dibbles, MD  oxyCODONE-acetaminophen (PERCOCET/ROXICET) 5-325 MG tablet Take 1 tablet by mouth every 6 (six) hours  as needed for severe pain. 02/03/22   Lorre Nick, MD  predniSONE (DELTASONE) 50 MG tablet Take 1 tablet (50 mg total) by mouth daily with breakfast. 07/03/21   Crain, Whitney L, PA  metFORMIN (GLUCOPHAGE) 500 MG tablet Take 1 tablet (500 mg total) by mouth 3 (three) times daily. 04/27/18 04/26/19  Loletta Specter, PA-C  Norgestimate-Ethinyl Estradiol Triphasic (ORTHO TRI-CYCLEN, 28,) 0.18/0.215/0.25 MG-35 MCG tablet Take 1 tablet by mouth daily. Patient not taking: Reported on 04/27/2018 11/18/17 02/07/19  Loletta Specter, PA-C  omeprazole (PRILOSEC) 40 MG capsule Take 1 capsule (40 mg total) by mouth  daily. 04/27/18 04/26/19  Loletta Specter, PA-C      Allergies    Citrus, Ciprocinonide [fluocinolone], Clindamycin/lincomycin, Latex, Penicillins, Shellfish allergy, and Aspirin    Review of Systems   Review of Systems  Constitutional:  Negative for chills, diaphoresis, fatigue and fever.  HENT:  Negative for congestion, rhinorrhea and sneezing.   Eyes: Negative.   Respiratory:  Negative for cough, chest tightness and shortness of breath.   Cardiovascular:  Negative for chest pain and leg swelling.  Gastrointestinal:  Negative for abdominal pain, blood in stool, diarrhea, nausea and vomiting.  Genitourinary:  Positive for menstrual problem, pelvic pain (Menstrual cramping) and vaginal bleeding. Negative for difficulty urinating, flank pain, frequency, hematuria, vaginal discharge and vaginal pain.  Musculoskeletal:  Negative for arthralgias and back pain.  Skin:  Negative for rash.  Neurological:  Negative for dizziness, speech difficulty, weakness, numbness and headaches.    Physical Exam Updated Vital Signs BP (!) 139/98   Pulse 84   Temp 98.4 F (36.9 C) (Oral)   Resp 16   Ht 5\' 6"  (1.676 m)   Wt (!) 150.1 kg   LMP 11/28/2022 (Approximate)   SpO2 97%   BMI 53.42 kg/m  Physical Exam Constitutional:      Appearance: She is well-developed.  HENT:     Head: Normocephalic and atraumatic.  Eyes:     Pupils: Pupils are equal, round, and reactive to light.  Cardiovascular:     Rate and Rhythm: Normal rate and regular rhythm.     Heart sounds: Normal heart sounds.  Pulmonary:     Effort: Pulmonary effort is normal. No respiratory distress.     Breath sounds: Normal breath sounds. No wheezing or rales.  Chest:     Chest wall: No tenderness.  Abdominal:     General: Bowel sounds are normal.     Palpations: Abdomen is soft.     Tenderness: There is no abdominal tenderness. There is no guarding or rebound.  Musculoskeletal:        General: Normal range of motion.      Cervical back: Normal range of motion and neck supple.  Lymphadenopathy:     Cervical: No cervical adenopathy.  Skin:    General: Skin is warm and dry.     Findings: No rash.  Neurological:     Mental Status: She is alert and oriented to person, place, and time.     ED Results / Procedures / Treatments   Labs (all labs ordered are listed, but only abnormal results are displayed) Labs Reviewed  URINALYSIS, W/ REFLEX TO CULTURE (INFECTION SUSPECTED) - Abnormal; Notable for the following components:      Result Value   APPearance CLOUDY (*)    Hgb urine dipstick TRACE (*)    Protein, ur 30 (*)    Leukocytes,Ua TRACE (*)    Non Squamous Epithelial PRESENT (*)  Bacteria, UA MANY (*)    All other components within normal limits  URINE CULTURE  PREGNANCY, URINE    EKG None  Radiology US PELVIC COMPLETE W TRANSVAGINAL AND TORSION R/O  Result Date: 11/30/2022 CLINICAL DATA:  Pelvic pain EXAM: TRANSABDOMINAL AND TRANSVAGINAL ULTRASOUND OF PELVIS TECHNIQUE: Both transabdominal and transvaginal ultrasound examinations of the pelvis were performed. Transabdominal technique was performed for global imaging of the pelvis including uterus, ovaries, adnexal regions, and pelvic cul-de-sac. It was necessary to proceed with endovaginal exam following the transabdominal exam to visualize the endometrium, ovaries, adnexa, and uterus. COMPARISON:  None available FINDINGS: Uterus Measurements: 7.4 x 4.2 x 4.3 cm = volume: 72 mL. No fibroids or other mass visualized. Endometrium Thickness: 5 mm.  No focal abnormality visualized. Right ovary Measurements: 3.3 x 1.7 x 2.1 cm = volume: 6.1 mL. Normal appearance. Left ovary Measurements: 4.1 x 2.2 x 3.4 cm = volume: 15.7 mL. Normal appearance. Other findings No abnormal free fluid. IMPRESSION: No significant sonographic abnormality of the uterus or ovaries. Electronically Signed   By: Acquanetta Belling M.D.   On: 11/30/2022 09:12    Procedures Procedures     Medications Ordered in ED Medications - No data to display  ED Course/ Medical Decision Making/ A&P                             Medical Decision Making Amount and/or Complexity of Data Reviewed Labs: ordered. Radiology: ordered.   Patient is a 32 year old female who presents with some knots in her stomach with some pelvic pain.  She denies any vaginal discharge.  She typically gets these knots when she is on her menstrual cycle and then they go away.  Her pregnancy test is negative.  She had a pelvic ultrasound which was negative.  Her urine has some bacteria but she is denying any urinary symptoms.  So decision was made not to treat with antibiotics.  On feeling her abdomen, she does not have any pain over her pelvic area per se but the little knots that seem to be tender seems to be more like possible little lymph nodes in her abdominal wall.  I do not feel any hernias.  There are little pea-sized knots in her abdominal wall which I suspect are benign, particularly if they are only there during her menstrual cycle.  At this point I did not feel that she needed further abdominal imaging.  Advised her to monitor this and if they continue or worsen, advised her to follow-up with her primary care doctor or OB/GYN.  She was given information about the women's outpatient center.  Return precautions were given.  Final Clinical Impression(s) / ED Diagnoses Final diagnoses:  Pelvic pain in female    Rx / DC Orders ED Discharge Orders     None         Rolan Bucco, MD 11/30/22 [phone]

## 2022-12-01 LAB — URINE CULTURE: Culture: 30000 — AB

## 2022-12-02 LAB — URINE CULTURE

## 2022-12-03 ENCOUNTER — Telehealth (HOSPITAL_BASED_OUTPATIENT_CLINIC_OR_DEPARTMENT_OTHER): Payer: Self-pay

## 2022-12-03 NOTE — Progress Notes (Signed)
ED Antimicrobial Stewardship Positive Culture Follow Up   Madison Cantu is an 32 y.o. female who presented to [facility] on 11/30/2022 with a chief complaint of  Chief Complaint  Patient presents with   Abdominal Pain    Recent Results (from the past 720 hour(s))  Urine Culture     Status: Abnormal   Collection Time: 11/30/22  7:07 AM   Specimen: Urine, Random  Result Value Ref Range Status   Specimen Description   Final    URINE, RANDOM Performed at [facility], [address] [place], [place] [address]    Special Requests   Final    NONE Reflexed from [id number] Performed at [facility], [address] [place], [place] [address]    Culture 30,000 COLONIES/mL PROTEUS MIRABILIS (A)  Final   Report Status 12/02/2022 FINAL  Final   Organism ID, Bacteria PROTEUS MIRABILIS (A)  Final      Susceptibility   Proteus mirabilis - MIC*    AMPICILLIN <=2 SENSITIVE Sensitive     CEFAZOLIN 8 SENSITIVE Sensitive     CEFEPIME <=0.12 SENSITIVE Sensitive     CEFTRIAXONE <=0.25 SENSITIVE Sensitive     CIPROFLOXACIN <=0.25 SENSITIVE Sensitive     GENTAMICIN <=1 SENSITIVE Sensitive     IMIPENEM 2 SENSITIVE Sensitive     NITROFURANTOIN 128 RESISTANT Resistant     TRIMETH/SULFA <=20 SENSITIVE Sensitive     AMPICILLIN/SULBACTAM <=2 SENSITIVE Sensitive     PIP/TAZO <=4 SENSITIVE Sensitive     * 30,000 COLONIES/mL PROTEUS MIRABILIS    [x]  Patient discharged originally without antimicrobial agent and treatment may now be indicated  New antibiotic prescription: No further treatment indicated at this time for asymptomatic bacteriuria.  ED Provider: Jacalyn Lefevre, MD   Susa Griffins [place] 12/03/2022, 8:12 AM Clinical Pharmacist Monday - Friday phone -  [phone] Saturday - Sunday phone - [phone]

## 2022-12-03 NOTE — Telephone Encounter (Signed)
Post ED Visit - Positive Culture Follow-up  Culture report reviewed by antimicrobial stewardship pharmacist: [facility] [x]  Francene Finders [place], [place].D. []  Celedonio Miyamoto, Pharm.D., BCPS AQ-ID []  Garvin Fila, Pharm.D., BCPS []  Georgina Pillion, Pharm.D., BCPS []  [place], [address].D., BCPS, AAHIVP []  Estella Husk, Pharm.D., BCPS, AAHIVP []  Lysle Pearl, PharmD, BCPS []  Phillips Climes, PharmD, BCPS []  Agapito Games, PharmD, BCPS []  Verlan Friends, PharmD []  Mervyn Gay, PharmD, BCPS []  Vinnie Level, PharmD  [facility] []  Len Childs, PharmD []  Greer Pickerel, PharmD []  Adalberto Cole, PharmD []  Perlie Gold, Rph []  Lonell Face) Jean Rosenthal, PharmD []  Earl Many, PharmD []  Junita Push, PharmD []  Dorna Leitz, PharmD []  Terrilee Files, PharmD []  Lynann Beaver, PharmD []  Keturah Barre, PharmD []  Loralee Pacas, PharmD []  Bernadene Person, PharmD   Positive urine culture Not treated, no treatment needed for asymptomatic bacteruria and no further patient follow-up is required at this time.  Sandria Senter 12/03/2022, 9:51 AM

## 2023-02-24 ENCOUNTER — Other Ambulatory Visit (HOSPITAL_COMMUNITY): Payer: Self-pay | Admitting: Emergency Medicine

## 2023-03-12 ENCOUNTER — Other Ambulatory Visit (HOSPITAL_COMMUNITY): Payer: Self-pay

## 2023-03-23 ENCOUNTER — Emergency Department (HOSPITAL_BASED_OUTPATIENT_CLINIC_OR_DEPARTMENT_OTHER)
Admission: EM | Admit: 2023-03-23 | Discharge: 2023-03-23 | Disposition: A | Discharge status: Home / Self Care | Payer: Self-pay | Attending: Emergency Medicine | Admitting: Emergency Medicine

## 2023-03-23 ENCOUNTER — Encounter (HOSPITAL_BASED_OUTPATIENT_CLINIC_OR_DEPARTMENT_OTHER): Payer: Self-pay | Admitting: Emergency Medicine

## 2023-03-23 ENCOUNTER — Other Ambulatory Visit: Payer: Self-pay

## 2023-03-23 ENCOUNTER — Other Ambulatory Visit (HOSPITAL_BASED_OUTPATIENT_CLINIC_OR_DEPARTMENT_OTHER): Payer: Self-pay

## 2023-03-23 DIAGNOSIS — Z91040 Latex allergy status: Secondary | ICD-10-CM | POA: Insufficient documentation

## 2023-03-23 DIAGNOSIS — Z7951 Long term (current) use of inhaled steroids: Secondary | ICD-10-CM | POA: Insufficient documentation

## 2023-03-23 DIAGNOSIS — K029 Dental caries, unspecified: Secondary | ICD-10-CM | POA: Insufficient documentation

## 2023-03-23 DIAGNOSIS — Z7984 Long term (current) use of oral hypoglycemic drugs: Secondary | ICD-10-CM | POA: Insufficient documentation

## 2023-03-23 DIAGNOSIS — Z79899 Other long term (current) drug therapy: Secondary | ICD-10-CM | POA: Insufficient documentation

## 2023-03-23 DIAGNOSIS — J45909 Unspecified asthma, uncomplicated: Secondary | ICD-10-CM | POA: Insufficient documentation

## 2023-03-23 DIAGNOSIS — I10 Essential (primary) hypertension: Secondary | ICD-10-CM | POA: Insufficient documentation

## 2023-03-23 MED ORDER — CEFDINIR 300 MG PO CAPS
300.0000 mg | ORAL_CAPSULE | Freq: Two times a day (BID) | ORAL | 0 refills | Status: AC
  Filled 2023-03-23: qty 14, 7d supply, fill #0

## 2023-03-23 MED ORDER — TRAMADOL HCL 50 MG PO TABS
50.0000 mg | ORAL_TABLET | Freq: Four times a day (QID) | ORAL | 0 refills | Status: AC | PRN
  Filled 2023-03-23: qty 10, 3d supply, fill #0

## 2023-03-23 MED ORDER — IBUPROFEN 600 MG PO TABS
600.0000 mg | ORAL_TABLET | Freq: Three times a day (TID) | ORAL | 0 refills | Status: AC | PRN
  Filled 2023-03-23: qty 30, 10d supply, fill #0

## 2023-03-23 MED ORDER — AMLODIPINE BESYLATE 10 MG PO TABS
10.0000 mg | ORAL_TABLET | Freq: Every day | ORAL | 1 refills | Status: AC
  Filled 2023-03-23: qty 30, 30d supply, fill #0

## 2023-03-23 NOTE — ED Provider Notes (Signed)
[facility] Provider Note   CSN: [id number] Arrival date & time: 03/23/23  1542     History  Chief Complaint  Patient presents with   Dental Pain    Madison Cantu is a 32 y.o. female.  The history is provided by the patient and medical records. No language interpreter was used.  Dental Pain    32 year old female significant history of hypertension, asthma presenting with complaint of dental pain.  Patient states she has had recurrent dental pain to her left upper tooth ongoing for more than a year.  It was first started when she had a wisdom tooth removed and in the process one of her adjacent tooth was injured.  Nothing was done about it.  Since then she has had recurrent pain to the same tooth.  She endorsed worsening pain for the past several weeks.  Pain not adequately controlled despite taking over-the-counter medications such as Tylenol and ibuprofen.  She is tries to find a dentist but unable to find one.  She endorsed worsening pain.  Patient has history of high blood pressure but does not have blood pressure medication have not been taking it.  She denies fever, recent trauma, trouble swallowing, neck pain, hearing changes or rash.  Home Medications Prior to Admission medications   Medication Sig Start Date End Date Taking? Authorizing Provider  albuterol (VENTOLIN HFA) 108 (90 Base) MCG/ACT inhaler Inhale 2 puffs into the lungs every 6 (six) hours as needed for wheezing or shortness of breath. 01/21/21   Jeannie Fend, PA-C  amLODipine (NORVASC) 10 MG tablet Take 1 tablet (10 mg total) by mouth daily. 07/08/20   Grayce Sessions, NP  benzonatate (TESSALON) 100 MG capsule Take 1 capsule (100 mg total) by mouth every 8 (eight) hours. 04/29/22   Linwood Dibbles, MD  cefdinir (OMNICEF) 300 MG capsule Take 1 capsule (300 mg total) by mouth 2 (two) times daily. 01/23/22   Petrucelli, Samantha R, PA-C  fluticasone (FLONASE) 50 MCG/ACT  nasal spray Place 2 sprays into both nostrils daily for 14 days. 08/23/21 09/06/21  Leath-Warren, Sadie Haber, NP  guaiFENesin (MUCINEX) 600 MG 12 hr tablet Take 1 tablet (600 mg total) by mouth 2 (two) times daily as needed to loosen phlegm or cough. 07/03/21   Crain, Whitney L, PA  hydrochlorothiazide (HYDRODIURIL) 25 MG tablet Take 1 tablet (25 mg total) by mouth daily. 07/08/20   Grayce Sessions, NP  HYDROcodone-acetaminophen (NORCO/VICODIN) 5-325 MG tablet Take 1 tablet by mouth every 4 (four) hours as needed for moderate pain. 12/06/20   Gilda Crease, MD  ibuprofen (ADVIL) 800 MG tablet Take 1 tablet (800 mg total) by mouth 3 (three) times daily. 12/23/21   Theron Arista, PA-C  lidocaine (XYLOCAINE) 2 % solution Use as directed 15 mLs in the mouth or throat every 4 (four) hours as needed for mouth pain. 01/23/22   Petrucelli, Samantha R, PA-C  metroNIDAZOLE (FLAGYL) 500 MG tablet Take 1 tablet (500 mg total) by mouth 2 (two) times daily. 01/23/22   Petrucelli, Samantha R, PA-C  montelukast (SINGULAIR) 10 MG tablet Take 1 tablet (10 mg total) by mouth at bedtime. 07/03/21   Crain, Whitney L, PA  naproxen (NAPROSYN) 500 MG tablet Take 1 tablet (500 mg total) by mouth 2 (two) times daily as needed for moderate pain. 01/23/22   Petrucelli, Samantha R, PA-C  ondansetron (ZOFRAN-ODT) 4 MG disintegrating tablet Take 1 tablet (4 mg total) by mouth  every 8 (eight) hours as needed for nausea or vomiting. 10/02/21   Marita Kansas, PA-C  oseltamivir (TAMIFLU) 75 MG capsule Take 1 capsule (75 mg total) by mouth every 12 (twelve) hours. 04/29/22   Linwood Dibbles, MD  oxyCODONE-acetaminophen (PERCOCET/ROXICET) 5-325 MG tablet Take 1 tablet by mouth every 6 (six) hours as needed for severe pain. 02/03/22   Lorre Nick, MD  predniSONE (DELTASONE) 50 MG tablet Take 1 tablet (50 mg total) by mouth daily with breakfast. 07/03/21   Crain, Whitney L, PA  metFORMIN (GLUCOPHAGE) 500 MG tablet Take 1 tablet (500 mg total) by mouth 3  (three) times daily. 04/27/18 04/26/19  Loletta Specter, PA-C  Norgestimate-Ethinyl Estradiol Triphasic (ORTHO TRI-CYCLEN, 28,) 0.18/0.215/0.25 MG-35 MCG tablet Take 1 tablet by mouth daily. Patient not taking: Reported on 04/27/2018 11/18/17 02/07/19  Loletta Specter, PA-C  omeprazole (PRILOSEC) 40 MG capsule Take 1 capsule (40 mg total) by mouth daily. 04/27/18 04/26/19  Loletta Specter, PA-C      Allergies    Citrus, Ciprocinonide [fluocinolone], Clindamycin/lincomycin, Latex, Penicillins, Shellfish allergy, and Aspirin    Review of Systems   Review of Systems  All other systems reviewed and are negative.   Physical Exam Updated Vital Signs BP (!) 189/104 (BP Location: Right Arm)   Pulse 100   Temp 98.7 F (37.1 C)   Resp 18   Ht 5\' 6"  (1.676 m)   Wt (!) 155.1 kg   LMP 02/18/2023   SpO2 100%   BMI 55.20 kg/m  Physical Exam Vitals and nursing note reviewed.  Constitutional:      General: She is not in acute distress.    Appearance: She is well-developed.  HENT:     Head: Atraumatic.     Mouth/Throat:     Comments: Mouth: Dental decay noted to tooth #16 with surrounding gingival erythema and tenderness to palpation but no obvious abscess or facial involvement. Eyes:     Conjunctiva/sclera: Conjunctivae normal.  Pulmonary:     Effort: Pulmonary effort is normal.  Musculoskeletal:     Cervical back: Neck supple.  Skin:    Findings: No rash.  Neurological:     Mental Status: She is alert.  Psychiatric:        Mood and Affect: Mood normal.     ED Results / Procedures / Treatments   Labs (all labs ordered are listed, but only abnormal results are displayed) Labs Reviewed - No data to display  EKG None  Radiology No results found.  Procedures Procedures    Medications Ordered in ED Medications - No data to display  ED Course/ Medical Decision Making/ A&P                                 Medical Decision Making  BP (!) 189/104 (BP Location: Right  Arm)   Pulse 100   Temp 98.7 F (37.1 C)   Resp 18   Ht 5\' 6"  (1.676 m)   Wt (!) 155.1 kg   LMP 02/18/2023   SpO2 100%   BMI 55.20 kg/m   79:28 PM 32 year old female significant history of hypertension, asthma presenting with complaint of dental pain.  Patient states she has had recurrent dental pain to her left upper tooth ongoing for more than a year.  It was first started when she had a wisdom tooth removed and in the process one of her adjacent tooth was injured.  Nothing was done about it.  Since then she has had recurrent pain to the same tooth.  She endorsed worsening pain for the past several weeks.  Pain not adequately controlled despite taking over-the-counter medications such as Tylenol and ibuprofen.  She is tries to find a dentist but unable to find one.  She endorsed worsening pain.  Patient has history of high blood pressure but does not have blood pressure medication have not been taking it.  She denies fever, recent trauma, trouble swallowing, neck pain, hearing changes or rash.  Exam remarkable for tenderness to tooth #15 on palpation.  Patient appears to have an old dental fracture along with dental decay and surrounding gingival erythema.  No obvious abscess amenable for drainage.  Plan to discharge home with [facility] and anti-inflammatory medication.  Patient was noted to have elevated blood pressure of 189/104.  She does have known history of hypertension but not taking her medication.  Since patient has uncontrolled hypertension, will prescribe antihypertensive medication and encourage patient to follow-up with PCP for recheck.  Return precaution discussed.  Imaging such as lateral facial CT consider but not performed as I have low suspicion for deep tissue infection.        Final Clinical Impression(s) / ED Diagnoses Final diagnoses:  Pain due to dental caries  Uncontrolled hypertension    Rx / DC Orders ED Discharge Orders          Ordered    amLODipine  (NORVASC) 10 MG tablet  Daily        03/23/23 1623    cefdinir (OMNICEF) 300 MG capsule  2 times daily        03/23/23 1623    ibuprofen (ADVIL) 600 MG tablet  Every 8 hours PRN        03/23/23 1623              Fayrene Helper, PA-C 03/23/23 1624    Long, Arlyss Repress, MD 03/23/23 2036

## 2023-03-23 NOTE — ED Triage Notes (Signed)
Pt with dental pain  (LT upper) for weeks; she is trying to get in with a dentist but can't find one accepting new pts

## 2023-03-23 NOTE — Discharge Instructions (Signed)
You have been evaluated for your symptoms.  Please take antibiotic as prescribed for dental pain but it is important for you to call and follow-up with dentist for further managements of your condition.  You would likely benefit from a dental extraction.  Your blood pressure is high today, please take blood pressure medication and follow-up with your doctor for recheck.

## 2023-03-23 NOTE — ED Notes (Signed)
Pt staes dental pain for a year and she states had her wisdom teeth pulled and they chipped her other tooth beside the left upper , pt states just moved from [place] and doesn't have a dentist

## 2024-02-14 IMAGING — CT CT HEAD W/O CM
4 series · 16 of 47 positions shown, 18 images · non-contrast
Comparison: None Available.

CLINICAL DATA: Head trauma. Fall, striking the left forehead
against [place]en 1 week ago. Persistent headache.



[Series 3: head wo · axial · 0.46mm/px · z∈[-161,-41]mm · 7 of 33 slices shown, 9 images]
[im 5/33  brain]
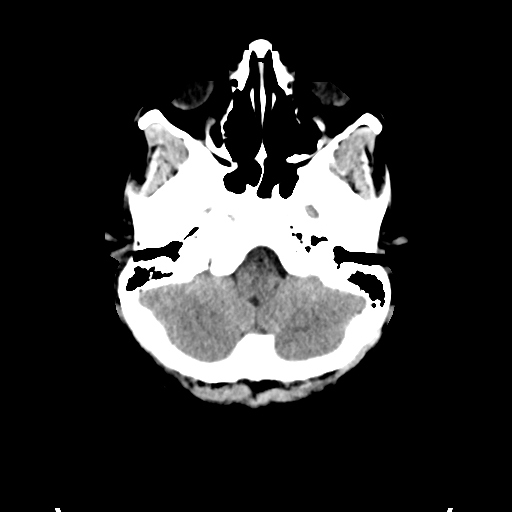
[im 5/33  bone]
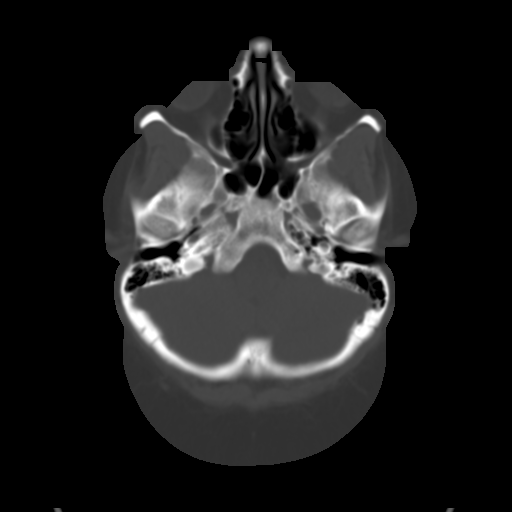
[im 9/33  brain]
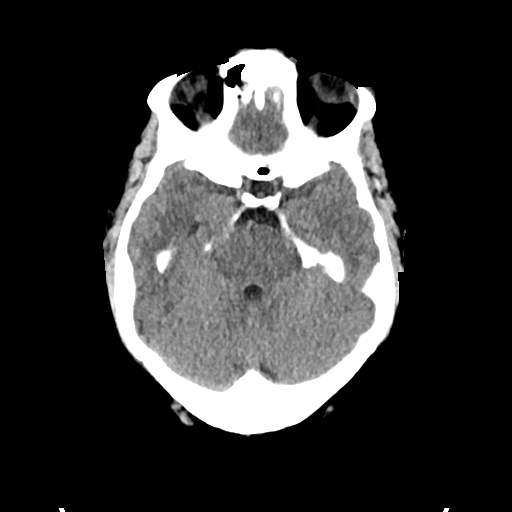
[im 13/33  brain]
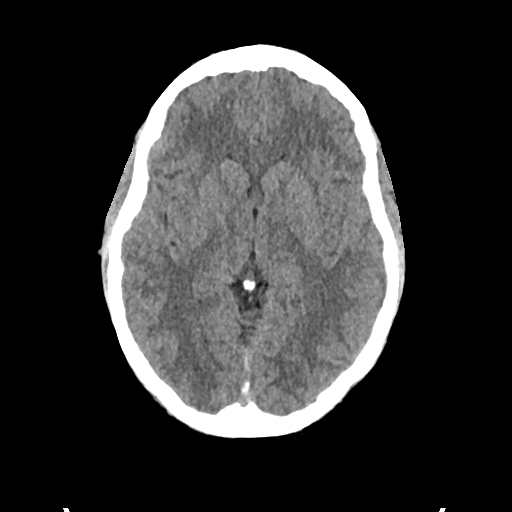
[im 17/33  brain]
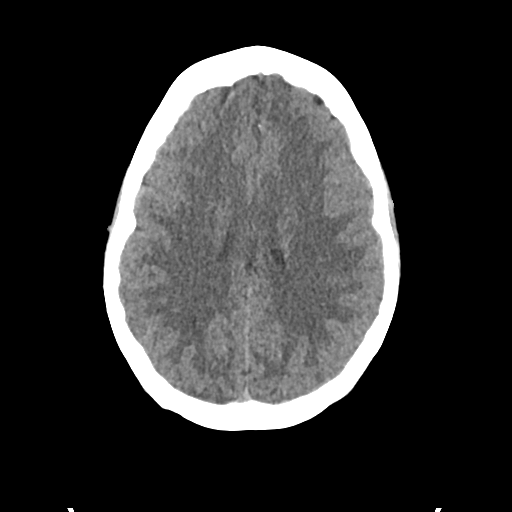
[im 21/33  brain]
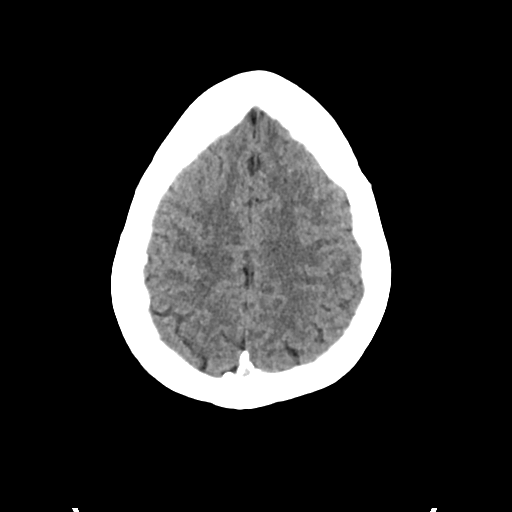
[im 21/33  bone]
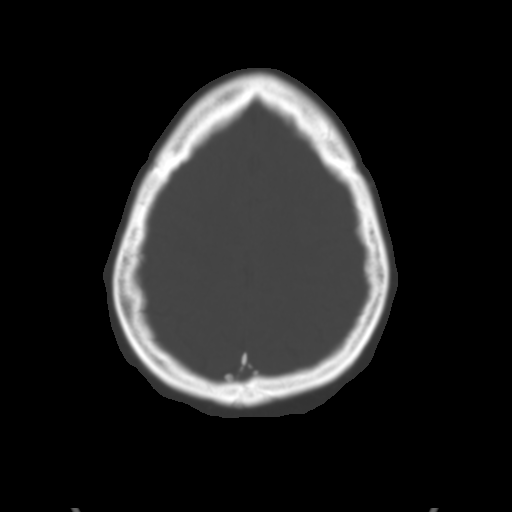
[im 25/33  brain]
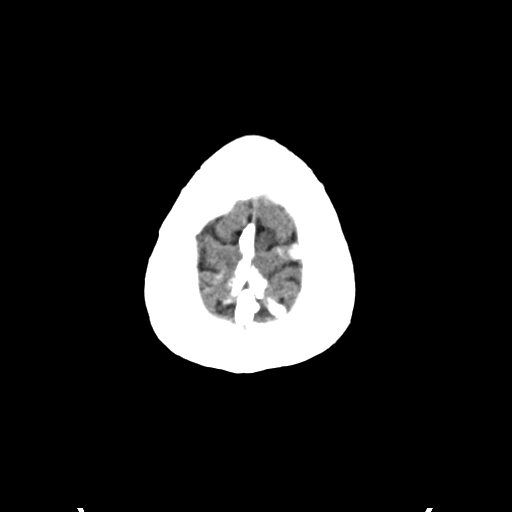
[im 29/33  brain]
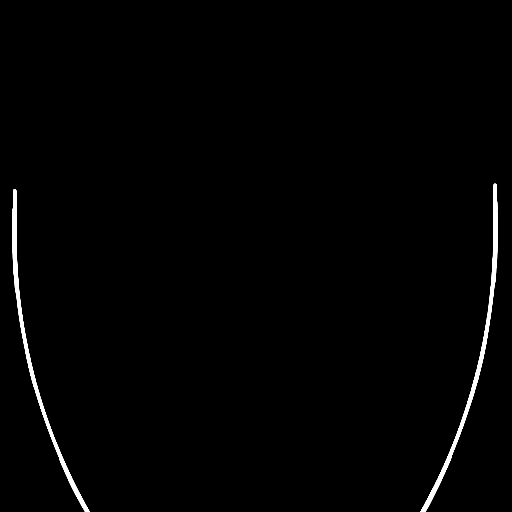

[Series 4: head bone · axial · 0.46mm/px · z∈[-165,-133]mm · 3 of 83 slices shown]
[im 9/83  bone]
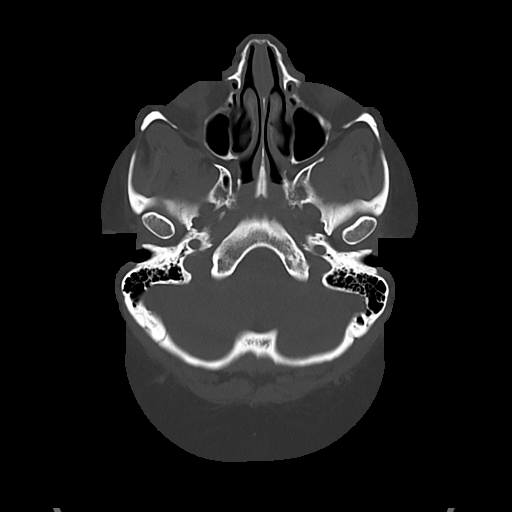
[im 17/83  bone]
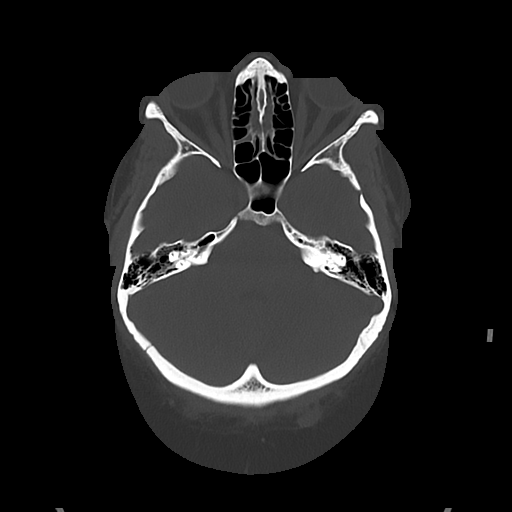
[im 25/83  bone]
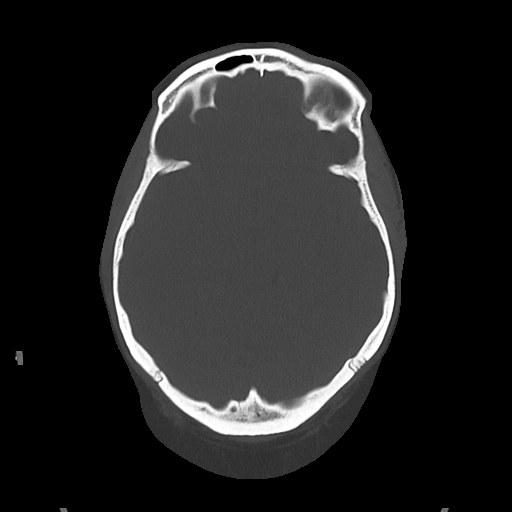

[Series 5: cor soft · coronal · 0.30mm/px · 3 of 75 slices shown]
[im 25/75  brain]
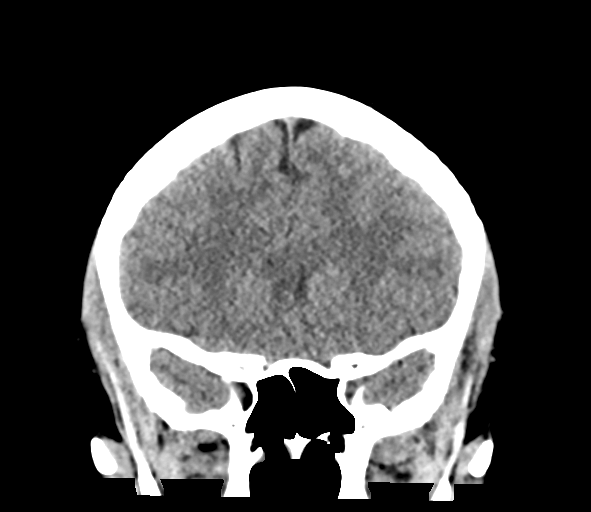
[im 33/75  brain]
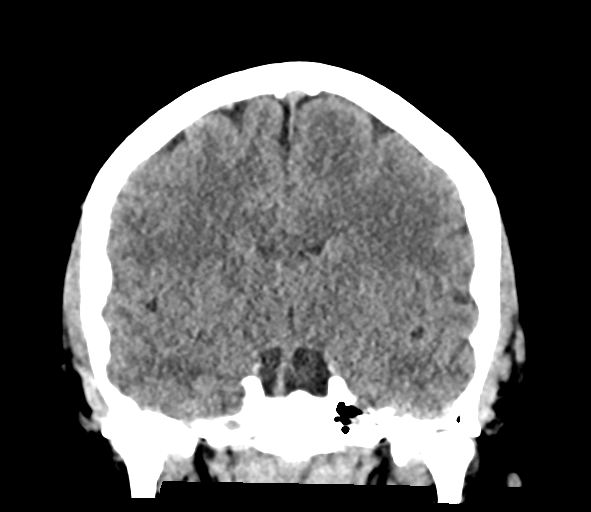
[im 42/75  brain]
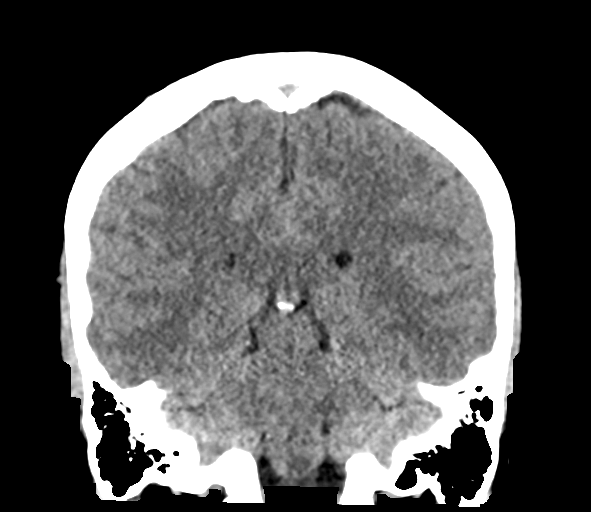

[Series 6: sag soft · sagittal · 0.30mm/px · 3 of 60 slices shown]
[im 20/60  brain]
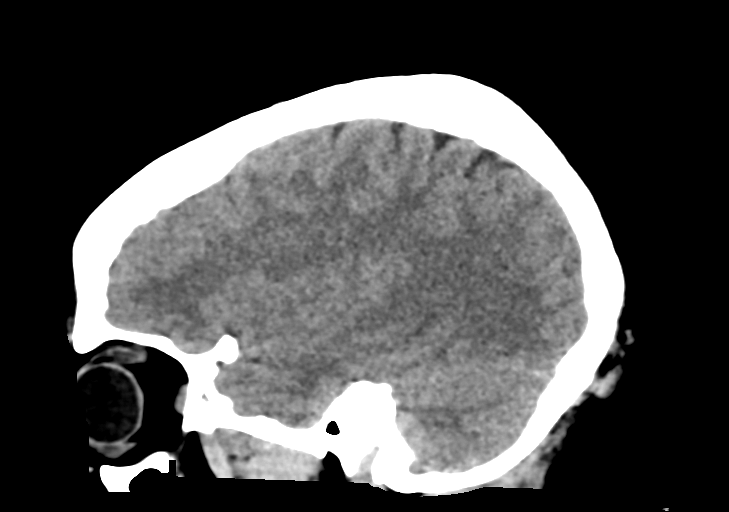
[im 30/60  brain]
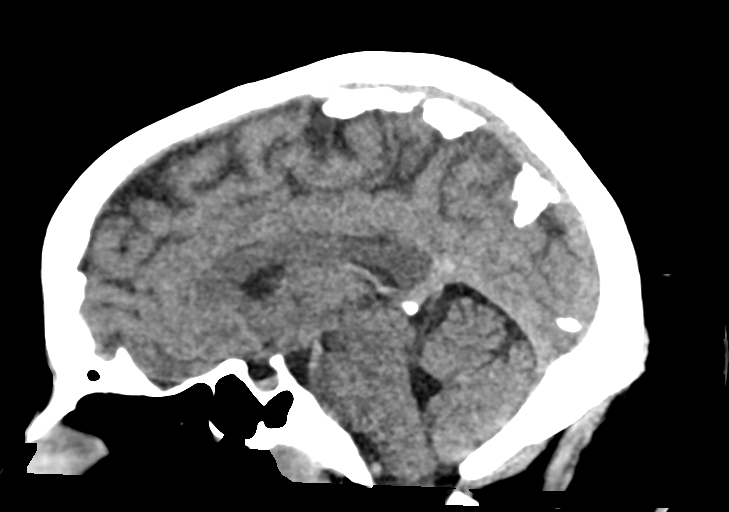
[im 40/60  brain]
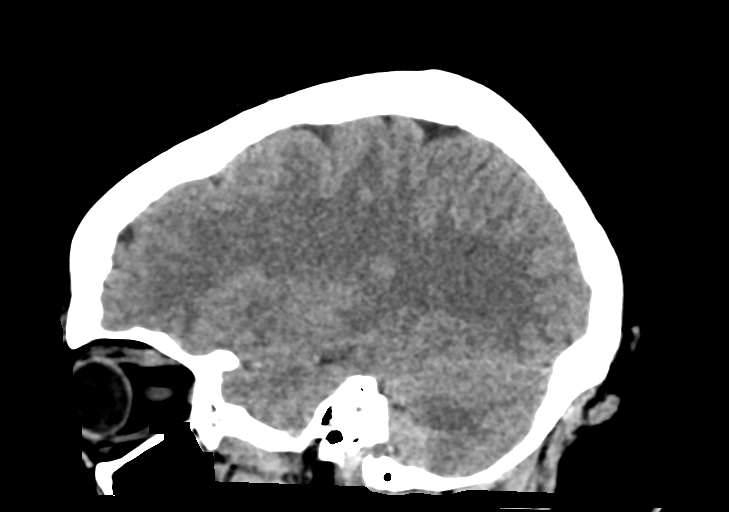

[16 of 47 positions shown; findings below may reference images not displayed]

FINDINGS: Brain: There is no evidence of an acute infarct, intracranial
hemorrhage, mass, midline shift, or extra-axial fluid collection.
The ventricles and sulci are normal.

Vascular: No hyperdense vessel.

Skull: No acute fracture or suspicious osseous lesion.

Sinuses/Orbits: Minimal right ethmoid air cell mucosal thickening.
Clear mastoid air cells. Unremarkable orbits.

Other: None.
IMPRESSION: Negative head CT.
